# Patient Record
Sex: Female | Born: 1970 | Race: White | Hispanic: No | Marital: Married | Smoking: Former smoker
Health system: Southern US, Community
[De-identification: ages and names within clinical notes are randomized; demographics above are authoritative.]

## PROBLEM LIST (undated history)

## (undated) DIAGNOSIS — E282 Polycystic ovarian syndrome: Secondary | ICD-10-CM

## (undated) DIAGNOSIS — Z780 Asymptomatic menopausal state: Secondary | ICD-10-CM

## (undated) DIAGNOSIS — M2550 Pain in unspecified joint: Secondary | ICD-10-CM

## (undated) DIAGNOSIS — E538 Deficiency of other specified B group vitamins: Secondary | ICD-10-CM

## (undated) DIAGNOSIS — R7303 Prediabetes: Secondary | ICD-10-CM

## (undated) DIAGNOSIS — R0602 Shortness of breath: Secondary | ICD-10-CM

## (undated) DIAGNOSIS — F329 Major depressive disorder, single episode, unspecified: Secondary | ICD-10-CM

## (undated) DIAGNOSIS — R5383 Other fatigue: Secondary | ICD-10-CM

## (undated) DIAGNOSIS — K760 Fatty (change of) liver, not elsewhere classified: Secondary | ICD-10-CM

## (undated) DIAGNOSIS — E559 Vitamin D deficiency, unspecified: Secondary | ICD-10-CM

## (undated) DIAGNOSIS — M549 Dorsalgia, unspecified: Secondary | ICD-10-CM

## (undated) DIAGNOSIS — R600 Localized edema: Secondary | ICD-10-CM

## (undated) DIAGNOSIS — F419 Anxiety disorder, unspecified: Secondary | ICD-10-CM

## (undated) DIAGNOSIS — F32A Depression, unspecified: Secondary | ICD-10-CM

## (undated) DIAGNOSIS — N920 Excessive and frequent menstruation with regular cycle: Secondary | ICD-10-CM

## (undated) DIAGNOSIS — M25519 Pain in unspecified shoulder: Secondary | ICD-10-CM

## (undated) HISTORY — DX: Fatty (change of) liver, not elsewhere classified: K76.0

## (undated) HISTORY — DX: Localized edema: R60.0

## (undated) HISTORY — DX: Anxiety disorder, unspecified: F41.9

## (undated) HISTORY — DX: Prediabetes: R73.03

## (undated) HISTORY — DX: Vitamin D deficiency, unspecified: E55.9

## (undated) HISTORY — DX: Asymptomatic menopausal state: Z78.0

## (undated) HISTORY — DX: Pain in unspecified joint: M25.50

## (undated) HISTORY — DX: Dorsalgia, unspecified: M54.9

## (undated) HISTORY — DX: Pain in unspecified shoulder: M25.519

## (undated) HISTORY — DX: Polycystic ovarian syndrome: E28.2

## (undated) HISTORY — DX: Depression, unspecified: F32.A

## (undated) HISTORY — DX: Deficiency of other specified B group vitamins: E53.8

## (undated) HISTORY — DX: Shortness of breath: R06.02

## (undated) HISTORY — DX: Excessive and frequent menstruation with regular cycle: N92.0

## (undated) HISTORY — DX: Other fatigue: R53.83

---
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
## 1998-01-07 ENCOUNTER — Other Ambulatory Visit: Admission: RE | Admit: 1998-01-07 | Discharge: 1998-01-07 | Payer: Self-pay | Admitting: *Deleted

## 1998-07-29 ENCOUNTER — Other Ambulatory Visit: Admission: RE | Admit: 1998-07-29 | Discharge: 1998-07-29 | Payer: Self-pay | Admitting: *Deleted

## 2001-07-17 ENCOUNTER — Other Ambulatory Visit: Admission: RE | Admit: 2001-07-17 | Discharge: 2001-07-17 | Payer: Self-pay | Admitting: Gynecology

## 2002-06-25 ENCOUNTER — Ambulatory Visit (HOSPITAL_COMMUNITY): Admission: AD | Admit: 2002-06-25 | Discharge: 2002-06-25 | Payer: Self-pay | Admitting: Gynecology

## 2002-06-26 ENCOUNTER — Encounter (INDEPENDENT_AMBULATORY_CARE_PROVIDER_SITE_OTHER): Payer: Self-pay

## 2004-02-11 ENCOUNTER — Encounter: Admission: RE | Admit: 2004-02-11 | Discharge: 2004-05-11 | Payer: Self-pay | Admitting: Family Medicine

## 2005-12-19 ENCOUNTER — Other Ambulatory Visit: Admission: RE | Admit: 2005-12-19 | Discharge: 2005-12-19 | Payer: Self-pay | Admitting: Family Medicine

## 2009-08-12 ENCOUNTER — Encounter: Admission: RE | Admit: 2009-08-12 | Discharge: 2009-08-12 | Payer: Self-pay | Admitting: Family Medicine

## 2009-09-14 ENCOUNTER — Ambulatory Visit: Payer: Self-pay | Admitting: Surgery

## 2009-11-10 ENCOUNTER — Ambulatory Visit (HOSPITAL_COMMUNITY): Admission: RE | Admit: 2009-11-10 | Discharge: 2009-11-10 | Payer: Self-pay | Admitting: Obstetrics and Gynecology

## 2009-12-08 ENCOUNTER — Ambulatory Visit (HOSPITAL_COMMUNITY): Admission: RE | Admit: 2009-12-08 | Discharge: 2009-12-08 | Payer: Self-pay | Admitting: Obstetrics and Gynecology

## 2010-06-01 ENCOUNTER — Inpatient Hospital Stay (HOSPITAL_COMMUNITY)
Admission: AD | Admit: 2010-06-01 | Discharge: 2010-06-04 | Disposition: A | Discharge status: Still In Hospital | Payer: Self-pay | Source: Home / Self Care | Attending: Obstetrics and Gynecology | Admitting: Obstetrics and Gynecology

## 2010-06-02 LAB — CBC
HCT: 39.3 % (ref 36.0–46.0)
Hemoglobin: 13.6 g/dL (ref 12.0–15.0)
MCH: 28.3 pg (ref 26.0–34.0)
MCHC: 34.6 g/dL (ref 30.0–36.0)
MCV: 81.7 fL (ref 78.0–100.0)
Platelets: 293 10*3/uL (ref 150–400)
RBC: 4.81 MIL/uL (ref 3.87–5.11)
RDW: 15.5 % (ref 11.5–15.5)
WBC: 12.2 10*3/uL — ABNORMAL HIGH (ref 4.0–10.5)

## 2010-06-02 LAB — RPR: RPR Ser Ql: NONREACTIVE

## 2010-06-05 NOTE — Discharge Summary (Signed)
  NAMELANNA, LABELLA              ACCOUNT NO.:  [id number]  MEDICAL RECORD NO.:  [id number]          PATIENT TYPE:  INP  LOCATION:  9133                          FACILITY:  [facility]  PHYSICIAN:  Ilda Mori, M.D.   DATE OF BIRTH:  09-Feb-1971  DATE OF ADMISSION:  06/01/2010 DATE OF DISCHARGE:  06/04/2010                              DISCHARGE SUMMARY   FINAL DIAGNOSES: 1. Uterine pregnancy, delivered. 2. Living, well-female child. 3. Failure to progress. 4. Failed induction.  SECONDARY DIAGNOSES:  None.  PROCEDURE:  Primary low transverse cesarean section.  COMPLICATIONS:  None.  CONDITION ON DISCHARGE:  Improved.  This is a 40 year old gravida 3, para 0-0-2-0 female at 40 weeks and 3 days who was admitted for elective induction.  The patient progressed to 7 cm and arrested for 4 hours.  In addition she developed a category 2 fetal heartrate tracing.  The  decision was made to proceed with cesarean section.  She was brought to the operating room by Dr. Carrington Clamp where a primary low transverse cesarean section was performed without complication with delivery of a 8-pound 6-ounce female infant.  Apgar scores 9 and 9.  The patient's postoperative course was benign without significant fever or anemia and on the third postoperative day, she was felt to be ready for discharge.  She was discharged on a regular diet, told to limit her activity.  She was given Percocet five #25 tablets to take 1-2 every 4 hours for severe pain, instructed to take over-the- counter ibuprofen 600 mg for mild-to-moderate pain and asked to continue her prenatal vitamins.  She was also asked to return to the office in 4 weeks for followup evaluation.  Laboratory data revealed an admission hemoglobin of 13.9, postop hemoglobin 11.1, white count post labor was 18,700.     Ilda Mori, M.D.     RK/MEDQ  D:  06/04/2010  T:  06/05/2010  Job:  [id number]  Electronically Signed by Ilda Mori  M.D. on 06/05/2010 04:38:07 PM

## 2010-06-06 ENCOUNTER — Encounter: Payer: Self-pay | Admitting: Obstetrics and Gynecology

## 2010-06-07 LAB — CBC
HCT: 33.9 % — ABNORMAL LOW (ref 36.0–46.0)
Hemoglobin: 11.1 g/dL — ABNORMAL LOW (ref 12.0–15.0)
MCH: 27.1 pg (ref 26.0–34.0)
MCHC: 32.7 g/dL (ref 30.0–36.0)
MCV: 82.7 fL (ref 78.0–100.0)
Platelets: 236 10*3/uL (ref 150–400)
RBC: 4.1 MIL/uL (ref 3.87–5.11)
RDW: 15.5 % (ref 11.5–15.5)
WBC: 18.7 10*3/uL — ABNORMAL HIGH (ref 4.0–10.5)

## 2010-06-10 NOTE — Op Note (Addendum)
NAMECAMDYNN, MARANTO              ACCOUNT NO.:  [id number]  MEDICAL RECORD NO.:  [id number]          PATIENT TYPE:  INP  LOCATION:  9133                          FACILITY:  [facility]  PHYSICIAN:  Carrington Clamp, M.D. DATE OF BIRTH:  10/12/1970  DATE OF PROCEDURE:  06/01/2010 DATE OF DISCHARGE:                              OPERATIVE REPORT   PREOPERATIVE DIAGNOSIS:  Arrest of active phase and nonreassuring fetal heart tones, remote from vaginal delivery.  POSTOPERATIVE DIAGNOSIS:  Arrest of active phase and nonreassuring fetal heart tones, remote from vaginal delivery.  PROCEDURE:  Primary low-transverse cesarean section.  SURGEON:  Carrington Clamp, M.D.  ASSISTANT:  None.  ANESTHESIA:  Epidural.  FINDINGS:  A girl infant vertex but direct OP presentation, Apgars 9 and 9, weight was 8 pounds 6 ounces.  Normal tubes, ovaries, and uterus were seen.  SPECIMENS:  None.  ESTIMATED BLOOD LOSS:  800 mL.  IV FLUIDS:  1500 mL.  URINE OUTPUT:  200 mL.  COMPLICATIONS:  None.  MEDICATIONS:  Clinda, Cipro, and Pitocin.  Counts were correct x3.  TECHNIQUE:  After adequate epidural anesthesia was achieved, the patient was prepped and draped in usual sterile fashion in dorsal supine position with leftward tilt.  A Pfannenstiel skin incision was made with the scalpel and carried down to the fascia with the Bovie cautery.  The fascia was incised in the midline and carried in transverse curvilinear manner with the Mayo scissors.  The fascia was reflected superiorly inferiorly from the rectus muscles, and the rectus muscle was split in the midline.  The bowel free portion of the anterior peritoneum was entered into bluntly.  The peritoneum was extended opened in a superior and inferior manner with good visualization of the bowel and the bladder.  The Alexis instrument was then placed and the vesicouterine fascia tented up and incised in a transverse curvilinear manner.  The  bladder flap was retracted inferiorly and a 2-cm incision was made in the upper portion of the lower uterine segment.  This was done until a clear fluid was noted on entry into amnion, and the incision was then extended manually in a transverse curvilinear manner.  Baby was identified.  The vertex OP presentation delivered without complication.  Baby was bulb suctioned.  The cord was clamped and cut and baby was handed to awaiting pediatrics.  The cord bloods were then obtained and placenta delivered manually.  The uterus then closed with a running lock stitch of 0 Monocryl.  An extra couple of stitches on the left hand corner were performed in order to ensure that the angle of the incision had been closed, and there was a pumping vessel at that point as well.  An imbricating layer was then performed to ensure hemostasis and once hemostasis was achieved, the abdomen was irrigated.  The uterine incision was rendered hemostatic, and the ovaries and tubes were inspected and found to be normal.  The peritoneum was then closed in a running stitch of 2-0 Vicryl in a running fashion.  This incorporated the rectus muscles as a separate layer.  The fascia was then closed with running stitch  of 0 Vicryl.  The subcutaneous tissues was rendered hemostatic with the Bovie cautery and irrigation, and the bowel, and the subcutaneous tissue was then closed with interrupted stitches of 2-0 plain gut.  The skin was closed with staples.  The patient tolerated the procedure well, returned to recovery in stable condition.     Carrington Clamp, M.D.     MH/MEDQ  D:  06/01/2010  T:  06/02/2010  Job:  [id number]  Electronically Signed by Carrington Clamp MD on 06/10/2010 11:31:46 AM

## 2010-08-12 ENCOUNTER — Other Ambulatory Visit: Payer: Self-pay | Admitting: Dermatology

## 2010-09-28 NOTE — Procedures (Signed)
DUPLEX DEEP VENOUS EXAM - LOWER EXTREMITY   INDICATION:  Bilateral lower extremity edema of ankles/feet.   HISTORY:  Edema:  Intermittent ankle/feet.  Trauma/Surgery:  No.  Pain:  No.  PE:  No.  Previous DVT:  No.  Anticoagulants:  No.  Other:   DUPLEX EXAM:                CFV   SFV   PopV  PTV    GSV                R  L  R  L  R  L  R   L  R  L  Thrombosis    o  o  o  o  o  o  o   o  o  o  Spontaneous   +  +  +  +  +  +  +   +  +  +  Phasic        +  +  +  +  +  +  +   +  +  +  Augmentation  +  +  +  +  +  +  +   +  +  +  Compressible  +  +  +  +  +  +  +   +  +  +  Competent     +  +  +  +  +  +  +   +  +  +   Legend:  + - yes  o - no  p - partial  D - decreased   IMPRESSION:  1. No evidence of deep or superficial venous thrombosis bilaterally.  2. No evidence of significant venous insufficiency bilaterally.    _____________________________  V. Charlena Cross, MD   AS/MEDQ  D:  09/14/2009  T:  09/15/2009  Job:  [id number] [id number]

## 2010-09-28 NOTE — Assessment & Plan Note (Signed)
OFFICE VISIT   DARRIN, APODACA  DOB:  [birth year]                                       09/14/2009  ZOXWR#:[id number]   REASON FOR VISIT:  Swelling.   HISTORY:  This is a 40 year old female seen at the request Dr. Yehuda Mao  for evaluation of leg swelling.  The patient states that she initially  noticed swelling in her right leg approximately 12-13 years ago after a  skiing trip.  She denies any actual trauma but did state she was  potentially troubled due to her ski boots.  Over the past 2 years,  however, her swelling has gotten worse and is now involving both legs  with the right leg being the more severe.  It is improved with  elevation.  She is not having shortness of breath.  She has no  ulceration.   REVIEW OF SYSTEMS:  GENERAL:  Positive for weight gain.  Otherwise negative except for what is documented in the HPI.  Review of  systems is documented in the encounter form.   PAST MEDICAL HISTORY:  Leg edema, acoustic neuroma surgery.   FAMILY HISTORY:  Negative for cardiovascular disease at an early age,  negative for PE, negative for DVT.   SOCIAL HISTORY:  She is married.  Does not smoke, quit 2008, does not  drink.   PHYSICAL EXAMINATION:  Heart rate is 74, blood pressure 116/74,  temperature is 97.8.  General:  She is well-appearing, in no distress.  HEENT:  Within normal limits.  Neck:  There is no JVD.  Respirations are  nonlabored.  Extremities:  She has edema limited to her foot.  She has  palpable pulses.  There is no ulceration.   DIAGNOSTIC STUDIES:  Ultrasound was performed today that shows no  evidence of acute or chronic deep vein thrombosis.  She does not have  any evidence of venous insufficiency.   ASSESSMENT:  Leg swelling, right greater than left.   PLAN:  After review of the patient's ultrasound, I did not feel like her  swelling is related to venous insufficiency.  This most likely  represents lymphedema secondary to a  minor trauma in the past.  I have  recommended that she be fitted for compression stockings, which will  need to be worn lifelong, and I have also recommend that she see a  lymphedema specialist to help to attempt massage therapy to see if she  can have some improvement in her symptoms.  I plan on seeing her back on  a p.r.n. basis.     Jorge Ny, MD  Electronically Signed   VWB/MEDQ  D:  09/14/2009  T:  09/15/2009  Job:  [id number]   cc:   Dr. Carilyn Goodpasture

## 2010-10-01 NOTE — H&P (Signed)
Deborah Underwood, Deborah Underwood                          ACCOUNT NO.:  [id number]   MEDICAL RECORD NO.:  [id number]                   PATIENT TYPE:  MAT   LOCATION:  [facility]                                 FACILITY:  [facility]   PHYSICIAN:  Juan H. Lily Peer, M.D.             DATE OF BIRTH:  1970/08/17   DATE OF ADMISSION:  06/25/2002  DATE OF DISCHARGE:                                HISTORY & PHYSICAL   CHIEF COMPLAINT:  First-trimester vaginal bleeding.   HISTORY:  The patient is a 40 year old, gravida 1, para 0, who was seen  earlier today in the office, not sure of her last menstrual period,  indicating she had been spotting for approximately eight days.  On  inspection in the office, there was blood in the vaginal vault.  Her cervix  was not dilated.  There was minimal oozing from the cervical os.  She was  taken to the ultrasound department in our office, and an ultrasound  demonstrated an intrauterine gestational sac with no fetal heart motion and  the crown rump length equal at 5.6 mm consistent with six weeks and two  days.  A normal yolk sac was seen, measuring 4.1 mm, right ovary corpus  lutein cyst.  Left ovary was normal.  The blood type is O positive.  She was  in no acute distress, and she was going to be followed with quantitative  beta-HCG.  She had one this afternoon and was to have a repeat in 48 hours  along with an ultrasound to see the trend and to see if fetal viability was  noted at that point.  She called me later this afternoon complaining of  increased cramping and passage of large blood clots and on examination in  the emergency room, there were blood clots in the vaginal vault which after  removing, her cervix was found to be approximately 1-2 cm dilated and oozing  of blood from cervical os.  Hemodynamically she was stable.  Her vital signs  were stable.  She was afebrile.   PAST MEDICAL HISTORY:  1. The patient is ALLERGIC to PENICILLIN, KEFLEX.  2. She has had a  wisdom tooth removed in the past.  3. She denies any history of any sexually transmitted diseases or any pelvic     surgery.   PHYSICAL EXAMINATION:  GENERAL:  Well-developed, well-nourished female with  the above-mentioned complaint.  HEENT:  Unremarkable.  NECK:  Supple.  Trachea midline.  No carotid bruits, no thyromegaly.  LUNGS:  Clear to auscultation.  No rhonchi or wheezes.  HEART:  Regular rate and rhythm.  No murmurs or gallops.  ABDOMEN:  Slight tenderness in lower abdomen.  No rebound or guarding.  PELVIC:  Described above.  EXTREMITIES:  No edema.  No clonus.   PRENATAL LABORATORY DATA:  Prenatal labs in the office that were drawn  June 07, 2002, demonstrated a hemoglobin of 13.5, hematocrit  39.5, with a  platelet count of 267,000.  Blood type was O positive, and she was antibody  screen negative.  Her VDRL and hepatitis B surface antigen and HIV were  negative.  Rubella with evidence of immunity.   </ASSESSMENT/PLAN>  A 40 year old, gravida 1, para 0, with uncertain last menstrual period with  vaginal bleeding for the past eight days in increased amount this evening.  Seen earlier in the office today with a gestational sac consistent with six  weeks and two days, but no fetal heart motion was noted.  No past history of  any sexually transmitted diseases, pelvic surgery.  On evaluation this  evening, it appears that she has an incomplete abortion with the amount of  bleeding and cervical dilatation, so she will be taken to our operating  where she will undergo a dilatation and curettage under MAC anesthesia and  paracervical block.  The patient is ALLERGIC to PENICILLIN, KEFLEX.  She had  taken Minocin during the month of December for acne.  The risks, benefits,  pros, and cons of dilatation and curettage were discussed, including  infection, bleeding, trauma to internal organs, and perforation of the  uterus.  In the event of blood transfusion, there is potential risk  for  anaphylactic reaction, hepatitis, and acquired immune deficiency syndrome  from blood and blood products.  She does have a potential cross-reactivity  with cephalosporins, we may hold off and give her an oral antibiotic to take  starting this evening and tomorrow at home such as erythromycin.  The  patient will be followed in the office in three weeks for a postop visit.  Planned emergency dilatation and evacuation this evening.                                               Juan H. Lily Peer, M.D.    JHF/MEDQ  D:  06/25/2002  T:  06/25/2002  Job:  [id number]

## 2010-10-01 NOTE — Op Note (Signed)
   NAMELUJUANA, Deborah Underwood                          ACCOUNT NO.:  [id number]   MEDICAL RECORD NO.:  [id number]                   PATIENT TYPE:  MAT   LOCATION:  [facility]                                 [facility]ACILITY:  [facility]   PHYSICIAN:  Juan H. Lily Peer, M.D.             DATE OF BIRTH:  12/30/1970   DATE OF PROCEDURE:  06/25/2002  DATE OF DISCHARGE:                                 OPERATIVE REPORT   INDICATIONS FOR PROCEDURE:  This 40 year old gravida 1, para 0, with first  trimester incomplete abortion.   POSTOPERATIVE DIAGNOSIS:  First trimester incomplete abortion.   ANESTHESIA:  MAC and a paracervical block  consisting of 2% Xylocaine  1:100,000 epinephrine.   PROCEDURE:  Dilatation and evacuation.   FINDINGS:  Six weeks' size uterus, slightly anteverted.  No palpable adnexal  masses.  With blood coming outside the cervical os, with blood in the  vaginal vault.   DESCRIPTION OF PROCEDURE:  After the patient was adequately counseled, she  was taken to the operating room where she was placed in the low lithotomy  position.  Due to the fact that the patient had allergies to penicillin with  Keflex, it was decided to administer 300 mg of clindamycin immediately upon  completion of the procedure.  After she was placed in the low lithotomy  position, the vagina and perineum were prepped and draped in the usual  sterile fashion and a red rubber Roxan Hockey was inserted to empty the bladder  contents of approximately 50 mL.  After this, a Graves speculum was inserted  into the vaginal vault and 2% Xylocaine with 1:100,000 epinephrine was  infiltrated into the cervical stroma at the 2, 4, 8, and 10 o'clock  positions for a total of 10 mL.  Once this was completed, the cervix was  grasped with a single-tooth tenaculum on the anterior cervical lip and the  cervical canal was dilated to allow an 8-mm suction curette to be introduced  into the intrauterine cavity to remove products of conception.  This  was  interchanged with a small serrated curettage and after removal of all  products of conception, the patient received 30 mg of Toradol in the  operating room.  The single-tooth tenaculum was removed.  The patient was  transferred to the recovery room with stable vital signs.  Blood loss was  minimal.  Fluid resuscitation consisted of 500 mL of lactated Ringers' and  her blood type was O-positive.                                               Juan H. Lily Peer, M.D.    JHF/MEDQ  D:  06/25/2002  T:  06/25/2002  Job:  [id number]

## 2011-11-30 ENCOUNTER — Other Ambulatory Visit: Payer: Self-pay | Admitting: Obstetrics and Gynecology

## 2015-07-02 ENCOUNTER — Other Ambulatory Visit: Payer: Self-pay | Admitting: Obstetrics and Gynecology

## 2015-07-03 LAB — CYTOLOGY - PAP

## 2015-08-26 DIAGNOSIS — F411 Generalized anxiety disorder: Secondary | ICD-10-CM | POA: Diagnosis not present

## 2015-12-22 DIAGNOSIS — J069 Acute upper respiratory infection, unspecified: Secondary | ICD-10-CM | POA: Diagnosis not present

## 2016-02-12 DIAGNOSIS — Z131 Encounter for screening for diabetes mellitus: Secondary | ICD-10-CM | POA: Diagnosis not present

## 2016-02-12 DIAGNOSIS — R350 Frequency of micturition: Secondary | ICD-10-CM | POA: Diagnosis not present

## 2016-02-18 DIAGNOSIS — F411 Generalized anxiety disorder: Secondary | ICD-10-CM | POA: Diagnosis not present

## 2016-02-29 DIAGNOSIS — B078 Other viral warts: Secondary | ICD-10-CM | POA: Diagnosis not present

## 2016-03-02 DIAGNOSIS — M530 Cervicocranial syndrome: Secondary | ICD-10-CM | POA: Diagnosis not present

## 2016-03-02 DIAGNOSIS — M9901 Segmental and somatic dysfunction of cervical region: Secondary | ICD-10-CM | POA: Diagnosis not present

## 2016-03-03 DIAGNOSIS — F411 Generalized anxiety disorder: Secondary | ICD-10-CM | POA: Diagnosis not present

## 2016-03-08 DIAGNOSIS — M530 Cervicocranial syndrome: Secondary | ICD-10-CM | POA: Diagnosis not present

## 2016-03-08 DIAGNOSIS — M9901 Segmental and somatic dysfunction of cervical region: Secondary | ICD-10-CM | POA: Diagnosis not present

## 2016-03-09 DIAGNOSIS — F411 Generalized anxiety disorder: Secondary | ICD-10-CM | POA: Diagnosis not present

## 2016-03-10 DIAGNOSIS — M9901 Segmental and somatic dysfunction of cervical region: Secondary | ICD-10-CM | POA: Diagnosis not present

## 2016-03-10 DIAGNOSIS — M530 Cervicocranial syndrome: Secondary | ICD-10-CM | POA: Diagnosis not present

## 2016-03-22 DIAGNOSIS — M530 Cervicocranial syndrome: Secondary | ICD-10-CM | POA: Diagnosis not present

## 2016-03-22 DIAGNOSIS — M9901 Segmental and somatic dysfunction of cervical region: Secondary | ICD-10-CM | POA: Diagnosis not present

## 2016-03-23 DIAGNOSIS — M530 Cervicocranial syndrome: Secondary | ICD-10-CM | POA: Diagnosis not present

## 2016-03-23 DIAGNOSIS — M9901 Segmental and somatic dysfunction of cervical region: Secondary | ICD-10-CM | POA: Diagnosis not present

## 2016-04-01 DIAGNOSIS — F411 Generalized anxiety disorder: Secondary | ICD-10-CM | POA: Diagnosis not present

## 2016-04-04 DIAGNOSIS — M530 Cervicocranial syndrome: Secondary | ICD-10-CM | POA: Diagnosis not present

## 2016-04-04 DIAGNOSIS — M9901 Segmental and somatic dysfunction of cervical region: Secondary | ICD-10-CM | POA: Diagnosis not present

## 2016-04-06 DIAGNOSIS — M9901 Segmental and somatic dysfunction of cervical region: Secondary | ICD-10-CM | POA: Diagnosis not present

## 2016-04-06 DIAGNOSIS — M530 Cervicocranial syndrome: Secondary | ICD-10-CM | POA: Diagnosis not present

## 2016-04-12 DIAGNOSIS — M9901 Segmental and somatic dysfunction of cervical region: Secondary | ICD-10-CM | POA: Diagnosis not present

## 2016-04-12 DIAGNOSIS — M530 Cervicocranial syndrome: Secondary | ICD-10-CM | POA: Diagnosis not present

## 2016-04-13 DIAGNOSIS — R635 Abnormal weight gain: Secondary | ICD-10-CM | POA: Diagnosis not present

## 2016-04-13 DIAGNOSIS — E559 Vitamin D deficiency, unspecified: Secondary | ICD-10-CM | POA: Diagnosis not present

## 2016-04-13 DIAGNOSIS — R05 Cough: Secondary | ICD-10-CM | POA: Diagnosis not present

## 2016-04-13 DIAGNOSIS — R5383 Other fatigue: Secondary | ICD-10-CM | POA: Diagnosis not present

## 2016-04-18 DIAGNOSIS — M530 Cervicocranial syndrome: Secondary | ICD-10-CM | POA: Diagnosis not present

## 2016-04-18 DIAGNOSIS — M9901 Segmental and somatic dysfunction of cervical region: Secondary | ICD-10-CM | POA: Diagnosis not present

## 2016-04-20 DIAGNOSIS — M530 Cervicocranial syndrome: Secondary | ICD-10-CM | POA: Diagnosis not present

## 2016-04-20 DIAGNOSIS — M9901 Segmental and somatic dysfunction of cervical region: Secondary | ICD-10-CM | POA: Diagnosis not present

## 2016-04-22 DIAGNOSIS — F411 Generalized anxiety disorder: Secondary | ICD-10-CM | POA: Diagnosis not present

## 2016-04-25 DIAGNOSIS — M9901 Segmental and somatic dysfunction of cervical region: Secondary | ICD-10-CM | POA: Diagnosis not present

## 2016-04-25 DIAGNOSIS — M530 Cervicocranial syndrome: Secondary | ICD-10-CM | POA: Diagnosis not present

## 2016-04-27 DIAGNOSIS — M530 Cervicocranial syndrome: Secondary | ICD-10-CM | POA: Diagnosis not present

## 2016-04-27 DIAGNOSIS — M9901 Segmental and somatic dysfunction of cervical region: Secondary | ICD-10-CM | POA: Diagnosis not present

## 2016-05-03 DIAGNOSIS — M9901 Segmental and somatic dysfunction of cervical region: Secondary | ICD-10-CM | POA: Diagnosis not present

## 2016-05-03 DIAGNOSIS — F411 Generalized anxiety disorder: Secondary | ICD-10-CM | POA: Diagnosis not present

## 2016-05-03 DIAGNOSIS — M530 Cervicocranial syndrome: Secondary | ICD-10-CM | POA: Diagnosis not present

## 2016-05-27 DIAGNOSIS — F411 Generalized anxiety disorder: Secondary | ICD-10-CM | POA: Diagnosis not present

## 2016-06-07 DIAGNOSIS — F411 Generalized anxiety disorder: Secondary | ICD-10-CM | POA: Diagnosis not present

## 2016-07-04 DIAGNOSIS — F411 Generalized anxiety disorder: Secondary | ICD-10-CM | POA: Diagnosis not present

## 2016-07-13 DIAGNOSIS — M9901 Segmental and somatic dysfunction of cervical region: Secondary | ICD-10-CM | POA: Diagnosis not present

## 2016-07-13 DIAGNOSIS — M9902 Segmental and somatic dysfunction of thoracic region: Secondary | ICD-10-CM | POA: Diagnosis not present

## 2016-07-13 DIAGNOSIS — M530 Cervicocranial syndrome: Secondary | ICD-10-CM | POA: Diagnosis not present

## 2016-07-13 DIAGNOSIS — M9903 Segmental and somatic dysfunction of lumbar region: Secondary | ICD-10-CM | POA: Diagnosis not present

## 2016-07-18 DIAGNOSIS — M9902 Segmental and somatic dysfunction of thoracic region: Secondary | ICD-10-CM | POA: Diagnosis not present

## 2016-07-18 DIAGNOSIS — M530 Cervicocranial syndrome: Secondary | ICD-10-CM | POA: Diagnosis not present

## 2016-07-18 DIAGNOSIS — M9901 Segmental and somatic dysfunction of cervical region: Secondary | ICD-10-CM | POA: Diagnosis not present

## 2016-07-18 DIAGNOSIS — M9903 Segmental and somatic dysfunction of lumbar region: Secondary | ICD-10-CM | POA: Diagnosis not present

## 2016-07-19 DIAGNOSIS — D2262 Melanocytic nevi of left upper limb, including shoulder: Secondary | ICD-10-CM | POA: Diagnosis not present

## 2016-07-19 DIAGNOSIS — D485 Neoplasm of uncertain behavior of skin: Secondary | ICD-10-CM | POA: Diagnosis not present

## 2016-07-19 DIAGNOSIS — F411 Generalized anxiety disorder: Secondary | ICD-10-CM | POA: Diagnosis not present

## 2016-08-02 DIAGNOSIS — F411 Generalized anxiety disorder: Secondary | ICD-10-CM | POA: Diagnosis not present

## 2016-08-08 DIAGNOSIS — M9903 Segmental and somatic dysfunction of lumbar region: Secondary | ICD-10-CM | POA: Diagnosis not present

## 2016-08-08 DIAGNOSIS — M9902 Segmental and somatic dysfunction of thoracic region: Secondary | ICD-10-CM | POA: Diagnosis not present

## 2016-08-08 DIAGNOSIS — M530 Cervicocranial syndrome: Secondary | ICD-10-CM | POA: Diagnosis not present

## 2016-08-08 DIAGNOSIS — M9901 Segmental and somatic dysfunction of cervical region: Secondary | ICD-10-CM | POA: Diagnosis not present

## 2016-08-15 DIAGNOSIS — M79671 Pain in right foot: Secondary | ICD-10-CM | POA: Diagnosis not present

## 2016-08-15 DIAGNOSIS — M79672 Pain in left foot: Secondary | ICD-10-CM | POA: Diagnosis not present

## 2016-08-15 DIAGNOSIS — M2550 Pain in unspecified joint: Secondary | ICD-10-CM | POA: Diagnosis not present

## 2016-08-17 DIAGNOSIS — Z6839 Body mass index (BMI) 39.0-39.9, adult: Secondary | ICD-10-CM | POA: Diagnosis not present

## 2016-08-17 DIAGNOSIS — Z01419 Encounter for gynecological examination (general) (routine) without abnormal findings: Secondary | ICD-10-CM | POA: Diagnosis not present

## 2016-08-17 DIAGNOSIS — Z1231 Encounter for screening mammogram for malignant neoplasm of breast: Secondary | ICD-10-CM | POA: Diagnosis not present

## 2016-08-22 DIAGNOSIS — F411 Generalized anxiety disorder: Secondary | ICD-10-CM | POA: Diagnosis not present

## 2016-09-13 DIAGNOSIS — F411 Generalized anxiety disorder: Secondary | ICD-10-CM | POA: Diagnosis not present

## 2016-09-27 DIAGNOSIS — F411 Generalized anxiety disorder: Secondary | ICD-10-CM | POA: Diagnosis not present

## 2016-10-11 DIAGNOSIS — F411 Generalized anxiety disorder: Secondary | ICD-10-CM | POA: Diagnosis not present

## 2016-10-25 DIAGNOSIS — F411 Generalized anxiety disorder: Secondary | ICD-10-CM | POA: Diagnosis not present

## 2016-11-02 DIAGNOSIS — M9903 Segmental and somatic dysfunction of lumbar region: Secondary | ICD-10-CM | POA: Diagnosis not present

## 2016-11-02 DIAGNOSIS — M5441 Lumbago with sciatica, right side: Secondary | ICD-10-CM | POA: Diagnosis not present

## 2016-11-07 DIAGNOSIS — M5441 Lumbago with sciatica, right side: Secondary | ICD-10-CM | POA: Diagnosis not present

## 2016-11-07 DIAGNOSIS — M9903 Segmental and somatic dysfunction of lumbar region: Secondary | ICD-10-CM | POA: Diagnosis not present

## 2016-11-22 DIAGNOSIS — F411 Generalized anxiety disorder: Secondary | ICD-10-CM | POA: Diagnosis not present

## 2016-12-12 DIAGNOSIS — F411 Generalized anxiety disorder: Secondary | ICD-10-CM | POA: Diagnosis not present

## 2017-01-04 DIAGNOSIS — F411 Generalized anxiety disorder: Secondary | ICD-10-CM | POA: Diagnosis not present

## 2017-01-31 DIAGNOSIS — F411 Generalized anxiety disorder: Secondary | ICD-10-CM | POA: Diagnosis not present

## 2017-02-14 DIAGNOSIS — F411 Generalized anxiety disorder: Secondary | ICD-10-CM | POA: Diagnosis not present

## 2017-02-28 DIAGNOSIS — F411 Generalized anxiety disorder: Secondary | ICD-10-CM | POA: Diagnosis not present

## 2017-03-03 DIAGNOSIS — J029 Acute pharyngitis, unspecified: Secondary | ICD-10-CM | POA: Diagnosis not present

## 2017-03-14 DIAGNOSIS — F411 Generalized anxiety disorder: Secondary | ICD-10-CM | POA: Diagnosis not present

## 2017-04-04 DIAGNOSIS — F411 Generalized anxiety disorder: Secondary | ICD-10-CM | POA: Diagnosis not present

## 2017-04-18 DIAGNOSIS — F411 Generalized anxiety disorder: Secondary | ICD-10-CM | POA: Diagnosis not present

## 2017-05-02 DIAGNOSIS — F411 Generalized anxiety disorder: Secondary | ICD-10-CM | POA: Diagnosis not present

## 2017-05-22 DIAGNOSIS — F411 Generalized anxiety disorder: Secondary | ICD-10-CM | POA: Diagnosis not present

## 2017-06-06 DIAGNOSIS — F411 Generalized anxiety disorder: Secondary | ICD-10-CM | POA: Diagnosis not present

## 2017-07-23 DIAGNOSIS — J039 Acute tonsillitis, unspecified: Secondary | ICD-10-CM | POA: Diagnosis not present

## 2017-09-25 DIAGNOSIS — F4320 Adjustment disorder, unspecified: Secondary | ICD-10-CM | POA: Diagnosis not present

## 2018-01-09 DIAGNOSIS — L821 Other seborrheic keratosis: Secondary | ICD-10-CM | POA: Diagnosis not present

## 2018-01-09 DIAGNOSIS — L408 Other psoriasis: Secondary | ICD-10-CM | POA: Diagnosis not present

## 2018-01-24 DIAGNOSIS — F4323 Adjustment disorder with mixed anxiety and depressed mood: Secondary | ICD-10-CM | POA: Diagnosis not present

## 2018-01-30 DIAGNOSIS — Z6839 Body mass index (BMI) 39.0-39.9, adult: Secondary | ICD-10-CM | POA: Diagnosis not present

## 2018-01-30 DIAGNOSIS — Z01419 Encounter for gynecological examination (general) (routine) without abnormal findings: Secondary | ICD-10-CM | POA: Diagnosis not present

## 2018-02-02 DIAGNOSIS — F4323 Adjustment disorder with mixed anxiety and depressed mood: Secondary | ICD-10-CM | POA: Diagnosis not present

## 2018-02-08 DIAGNOSIS — F4323 Adjustment disorder with mixed anxiety and depressed mood: Secondary | ICD-10-CM | POA: Diagnosis not present

## 2018-02-22 DIAGNOSIS — F4323 Adjustment disorder with mixed anxiety and depressed mood: Secondary | ICD-10-CM | POA: Diagnosis not present

## 2018-02-28 DIAGNOSIS — J208 Acute bronchitis due to other specified organisms: Secondary | ICD-10-CM | POA: Diagnosis not present

## 2018-02-28 DIAGNOSIS — F4323 Adjustment disorder with mixed anxiety and depressed mood: Secondary | ICD-10-CM | POA: Diagnosis not present

## 2018-02-28 DIAGNOSIS — B9689 Other specified bacterial agents as the cause of diseases classified elsewhere: Secondary | ICD-10-CM | POA: Diagnosis not present

## 2018-03-14 DIAGNOSIS — F4323 Adjustment disorder with mixed anxiety and depressed mood: Secondary | ICD-10-CM | POA: Diagnosis not present

## 2018-03-29 DIAGNOSIS — F4323 Adjustment disorder with mixed anxiety and depressed mood: Secondary | ICD-10-CM | POA: Diagnosis not present

## 2018-04-04 DIAGNOSIS — F4323 Adjustment disorder with mixed anxiety and depressed mood: Secondary | ICD-10-CM | POA: Diagnosis not present

## 2018-04-10 DIAGNOSIS — F4323 Adjustment disorder with mixed anxiety and depressed mood: Secondary | ICD-10-CM | POA: Diagnosis not present

## 2018-04-24 DIAGNOSIS — F4323 Adjustment disorder with mixed anxiety and depressed mood: Secondary | ICD-10-CM | POA: Diagnosis not present

## 2018-05-01 DIAGNOSIS — F4323 Adjustment disorder with mixed anxiety and depressed mood: Secondary | ICD-10-CM | POA: Diagnosis not present

## 2018-05-14 DIAGNOSIS — F4323 Adjustment disorder with mixed anxiety and depressed mood: Secondary | ICD-10-CM | POA: Diagnosis not present

## 2018-05-23 DIAGNOSIS — F4323 Adjustment disorder with mixed anxiety and depressed mood: Secondary | ICD-10-CM | POA: Diagnosis not present

## 2018-05-27 DIAGNOSIS — R05 Cough: Secondary | ICD-10-CM | POA: Diagnosis not present

## 2018-05-27 DIAGNOSIS — J309 Allergic rhinitis, unspecified: Secondary | ICD-10-CM | POA: Diagnosis not present

## 2018-05-31 DIAGNOSIS — F4323 Adjustment disorder with mixed anxiety and depressed mood: Secondary | ICD-10-CM | POA: Diagnosis not present

## 2018-06-06 DIAGNOSIS — F4323 Adjustment disorder with mixed anxiety and depressed mood: Secondary | ICD-10-CM | POA: Diagnosis not present

## 2018-06-12 DIAGNOSIS — Z1231 Encounter for screening mammogram for malignant neoplasm of breast: Secondary | ICD-10-CM | POA: Diagnosis not present

## 2018-06-12 DIAGNOSIS — Z6839 Body mass index (BMI) 39.0-39.9, adult: Secondary | ICD-10-CM | POA: Diagnosis not present

## 2018-06-13 DIAGNOSIS — F4323 Adjustment disorder with mixed anxiety and depressed mood: Secondary | ICD-10-CM | POA: Diagnosis not present

## 2018-06-20 DIAGNOSIS — F4323 Adjustment disorder with mixed anxiety and depressed mood: Secondary | ICD-10-CM | POA: Diagnosis not present

## 2018-06-27 DIAGNOSIS — F4323 Adjustment disorder with mixed anxiety and depressed mood: Secondary | ICD-10-CM | POA: Diagnosis not present

## 2018-07-12 DIAGNOSIS — F4323 Adjustment disorder with mixed anxiety and depressed mood: Secondary | ICD-10-CM | POA: Diagnosis not present

## 2018-07-18 DIAGNOSIS — F4323 Adjustment disorder with mixed anxiety and depressed mood: Secondary | ICD-10-CM | POA: Diagnosis not present

## 2018-07-25 DIAGNOSIS — F4323 Adjustment disorder with mixed anxiety and depressed mood: Secondary | ICD-10-CM | POA: Diagnosis not present

## 2018-08-02 DIAGNOSIS — F4323 Adjustment disorder with mixed anxiety and depressed mood: Secondary | ICD-10-CM | POA: Diagnosis not present

## 2018-08-08 DIAGNOSIS — F4323 Adjustment disorder with mixed anxiety and depressed mood: Secondary | ICD-10-CM | POA: Diagnosis not present

## 2018-08-15 DIAGNOSIS — F4323 Adjustment disorder with mixed anxiety and depressed mood: Secondary | ICD-10-CM | POA: Diagnosis not present

## 2018-08-23 DIAGNOSIS — F4323 Adjustment disorder with mixed anxiety and depressed mood: Secondary | ICD-10-CM | POA: Diagnosis not present

## 2018-08-30 DIAGNOSIS — F4323 Adjustment disorder with mixed anxiety and depressed mood: Secondary | ICD-10-CM | POA: Diagnosis not present

## 2018-09-06 DIAGNOSIS — F4323 Adjustment disorder with mixed anxiety and depressed mood: Secondary | ICD-10-CM | POA: Diagnosis not present

## 2018-09-13 DIAGNOSIS — F4323 Adjustment disorder with mixed anxiety and depressed mood: Secondary | ICD-10-CM | POA: Diagnosis not present

## 2018-09-17 DIAGNOSIS — F4323 Adjustment disorder with mixed anxiety and depressed mood: Secondary | ICD-10-CM | POA: Diagnosis not present

## 2018-09-26 DIAGNOSIS — F4323 Adjustment disorder with mixed anxiety and depressed mood: Secondary | ICD-10-CM | POA: Diagnosis not present

## 2018-09-29 DIAGNOSIS — H6692 Otitis media, unspecified, left ear: Secondary | ICD-10-CM | POA: Diagnosis not present

## 2018-10-03 DIAGNOSIS — F4323 Adjustment disorder with mixed anxiety and depressed mood: Secondary | ICD-10-CM | POA: Diagnosis not present

## 2018-10-24 DIAGNOSIS — F4323 Adjustment disorder with mixed anxiety and depressed mood: Secondary | ICD-10-CM | POA: Diagnosis not present

## 2018-11-07 DIAGNOSIS — F4323 Adjustment disorder with mixed anxiety and depressed mood: Secondary | ICD-10-CM | POA: Diagnosis not present

## 2018-11-21 DIAGNOSIS — F4323 Adjustment disorder with mixed anxiety and depressed mood: Secondary | ICD-10-CM | POA: Diagnosis not present

## 2018-11-26 DIAGNOSIS — F4323 Adjustment disorder with mixed anxiety and depressed mood: Secondary | ICD-10-CM | POA: Diagnosis not present

## 2018-12-05 DIAGNOSIS — F4323 Adjustment disorder with mixed anxiety and depressed mood: Secondary | ICD-10-CM | POA: Diagnosis not present

## 2018-12-06 ENCOUNTER — Other Ambulatory Visit: Payer: Self-pay

## 2018-12-06 DIAGNOSIS — Z20822 Contact with and (suspected) exposure to covid-19: Secondary | ICD-10-CM

## 2018-12-08 LAB — NOVEL CORONAVIRUS, NAA: SARS-CoV-2, NAA: NOT DETECTED

## 2018-12-09 ENCOUNTER — Telehealth: Payer: Self-pay

## 2018-12-09 NOTE — Telephone Encounter (Signed)

## 2018-12-14 DIAGNOSIS — F4323 Adjustment disorder with mixed anxiety and depressed mood: Secondary | ICD-10-CM | POA: Diagnosis not present

## 2019-06-14 DIAGNOSIS — Z124 Encounter for screening for malignant neoplasm of cervix: Secondary | ICD-10-CM | POA: Diagnosis not present

## 2019-06-14 DIAGNOSIS — Z01419 Encounter for gynecological examination (general) (routine) without abnormal findings: Secondary | ICD-10-CM | POA: Diagnosis not present

## 2019-06-14 DIAGNOSIS — Z6841 Body Mass Index (BMI) 40.0 and over, adult: Secondary | ICD-10-CM | POA: Diagnosis not present

## 2019-07-18 DIAGNOSIS — Z6841 Body Mass Index (BMI) 40.0 and over, adult: Secondary | ICD-10-CM | POA: Diagnosis not present

## 2019-07-18 DIAGNOSIS — Z0000 Encounter for general adult medical examination without abnormal findings: Secondary | ICD-10-CM | POA: Diagnosis not present

## 2019-07-18 DIAGNOSIS — N898 Other specified noninflammatory disorders of vagina: Secondary | ICD-10-CM | POA: Diagnosis not present

## 2019-07-18 DIAGNOSIS — Z1231 Encounter for screening mammogram for malignant neoplasm of breast: Secondary | ICD-10-CM | POA: Diagnosis not present

## 2019-07-19 LAB — COMPREHENSIVE METABOLIC PANEL WITH GFR
Albumin: 4 (ref 3.5–5.0)
Calcium: 9.2 (ref 8.7–10.7)
GFR calc Af Amer: 79
GFR calc non Af Amer: 68
Globulin: 3

## 2019-07-19 LAB — BASIC METABOLIC PANEL
BUN: 12 (ref 4–21)
CO2: 23 — AB (ref 13–22)
Chloride: 104 (ref 99–108)
Creatinine: 1 (ref 0.5–1.1)
Glucose: 96
Potassium: 4.8 (ref 3.4–5.3)
Sodium: 141 (ref 137–147)

## 2019-07-19 LAB — HEMOGLOBIN A1C: Hemoglobin A1C: 5.6

## 2019-07-19 LAB — LIPID PANEL
Cholesterol: 205 — AB (ref 0–200)
HDL: 56 (ref 35–70)
LDL Cholesterol: 129
LDl/HDL Ratio: 2.3
Triglycerides: 112 (ref 40–160)

## 2019-07-19 LAB — HEPATIC FUNCTION PANEL
ALT: 21 (ref 7–35)
AST: 17 (ref 13–35)
Alkaline Phosphatase: 87 (ref 25–125)
Bilirubin, Total: 0.2

## 2019-07-19 LAB — TSH: TSH: 3.9 (ref 0.41–5.90)

## 2019-07-19 LAB — CBC AND DIFFERENTIAL
HCT: 43 (ref 36–46)
Hemoglobin: 14.2 (ref 12.0–16.0)
Neutrophils Absolute: 5
Platelets: 329 (ref 150–399)
WBC: 8.1

## 2019-07-19 LAB — CBC: RBC: 5.1 (ref 3.87–5.11)

## 2019-07-30 ENCOUNTER — Telehealth: Payer: Self-pay | Admitting: Family Medicine

## 2019-07-30 NOTE — Telephone Encounter (Signed)
Can schedule for Friday afternoon in a virtual slot.

## 2019-07-30 NOTE — Telephone Encounter (Signed)
Pt stated she is expeirncing peri-menopause since mid-Feburary and she has been bleeding non-stop vaginally (normal to heavy but last two weeks has been heavy). She has been very fatigue for a long time-her OBGYN did a blood test and her TSH has been on the high side and recommened that she see a PCP.   Pt would like to be established with Dr. [place] and can not wait until her next availability in July due to her bleeding vaginally non-stop since February.   Pt can be reached at [phone]

## 2019-07-30 NOTE — Telephone Encounter (Signed)
Pt is scheduled for in Office this Friday 3/19 at 3:00pm. She will come early to do npp.

## 2019-08-02 ENCOUNTER — Ambulatory Visit: Payer: BC Managed Care – PPO | Admitting: Family Medicine

## 2019-08-02 ENCOUNTER — Encounter: Payer: Self-pay | Admitting: Family Medicine

## 2019-08-02 ENCOUNTER — Other Ambulatory Visit: Payer: Self-pay

## 2019-08-02 VITALS — BP 128/80 | HR 100 | Resp 12 | Ht 67.0 in | Wt 278.0 lb

## 2019-08-02 DIAGNOSIS — R519 Headache, unspecified: Secondary | ICD-10-CM

## 2019-08-02 DIAGNOSIS — E559 Vitamin D deficiency, unspecified: Secondary | ICD-10-CM | POA: Diagnosis not present

## 2019-08-02 DIAGNOSIS — R200 Anesthesia of skin: Secondary | ICD-10-CM

## 2019-08-02 DIAGNOSIS — R0683 Snoring: Secondary | ICD-10-CM | POA: Diagnosis not present

## 2019-08-02 DIAGNOSIS — N938 Other specified abnormal uterine and vaginal bleeding: Secondary | ICD-10-CM | POA: Diagnosis not present

## 2019-08-02 DIAGNOSIS — R5383 Other fatigue: Secondary | ICD-10-CM

## 2019-08-02 LAB — CBC
HCT: 39.8 % (ref 35.0–45.0)
Hemoglobin: 12.9 g/dL (ref 11.7–15.5)
MCH: 27.4 pg (ref 27.0–33.0)
MCHC: 32.4 g/dL (ref 32.0–36.0)
MCV: 84.5 fL (ref 80.0–100.0)
MPV: 9.7 fL (ref 7.5–12.5)
Platelets: 422 10*3/uL — ABNORMAL HIGH (ref 140–400)
RBC: 4.71 10*6/uL (ref 3.80–5.10)
RDW: 13.7 % (ref 11.0–15.0)
WBC: 8.4 10*3/uL (ref 3.8–10.8)

## 2019-08-02 LAB — VITAMIN B12: Vitamin B-12: 544 pg/mL (ref 200–1100)

## 2019-08-02 LAB — TSH: TSH: 1.93 mIU/L

## 2019-08-02 LAB — VITAMIN D 25 HYDROXY (VIT D DEFICIENCY, FRACTURES): Vit D, 25-Hydroxy: 22 ng/mL — ABNORMAL LOW (ref 30–100)

## 2019-08-02 NOTE — Patient Instructions (Signed)
Fatigue is a common symptom associated with multiple factors: psychologic,medications, systemic illness, sleep disorders,infections, and unknown causes. Some work-up can be done to evaluate for common causes as thyroid disease,anemia,diabetes, or abnormalities in calcium,potassium,or sodium. Regular physical activity as tolerated and a healthy diet is usually might help and usually recommended for chronic fatigue.  I am sending you to have a discussion about sleep study. I will send a message to your gyn. I do not think vaginal bleeding is related to thyroid disease.  Will check B12 ans vit D.

## 2019-08-02 NOTE — Progress Notes (Signed)
HPI:   Deborah Underwood is a 49 y.o. female, who is here today to establish care.  Former PCP: [facility]. Last preventive routine visit: Last gyn exam 06/14/19.  Chronic medical problems: Schwannoma tumor resection in 2012. She is not longer following with oncologist. Postpartum depression.  She would like to go through recent lab results, ordered by her gyn.  -Hyperlipidemia. Currently she is on no pharmacologic treatment  Lab Results  Component Value Date   CHOL 205 (A) 07/19/2019   HDL 56 07/19/2019   LDLCALC 129 07/19/2019   TRIG 112 07/19/2019   Vitamin D deficiency: Currently she is on vitamin D 2000 units 5 tablets daily. She does not remember when her last 15 [place] vitamin D was checked, a few years ago.  Concerns today: Fatigue,global headache, "shaky,and foggy brain" for about 3 months, Fatigue has been worse for the past month. She does not wake up with headache.It is not associated with photophobia,N/V.  She sleeps about 6 hours. Frequently she falls asleep while she is waiting sitting in her car to pick up her child from school. She has not fallen asleep while driving.  No known hx of OSA but her husband has complained of her loud snoring. Sleeps about 6 hours. She does not feel rested first thing in the morning. Takes naps x 1-2 during the day.  Having vaginal bleeding since 06/21/2019, most of the time like a normal menstrual period but sometimes heavy. She wonders if she is getting closer to menopause.  No prior hx of abnormal menses. No associated pelvic pain,vaginal discharge,or urinary symptoms. According to patient, she was told that it could be related to thyroid disease. + Irritable. States that her TSH was in high normal range and she has read that normal range in wide and needed to be lower. She wonders if there would be serious adverse effects if she start thyroid hormonal supplementation.  Lab Results  Component Value Date   TSH 3.90 07/19/2019   Upon further questioning she reports that for 2-3 months she has had intermittent episodes of upper extremities numbness and needle sensation, when she is in bed. It does not follow dermatoma.  No problems during the day. Occasional neck pain,no radiated. Problem has been stable. No associated weakness.  Lab Results  Component Value Date   WBC 8.1 07/19/2019   HGB 14.2 07/19/2019   HCT 43 07/19/2019   MCV 82.7 06/02/2010   PLT 329 07/19/2019   Denies current depression or anxiety symptom.  No associated fever,chills,night sweats,or swollen glands.  Review of Systems  Constitutional: Negative for activity change and appetite change.  HENT: Negative for mouth sores, nosebleeds and sore throat.   Eyes: Negative for redness and visual disturbance.  Respiratory: Negative for cough, shortness of breath and wheezing.   Cardiovascular: Negative for chest pain, palpitations and leg swelling.  Gastrointestinal: Negative for abdominal pain.       Negative for changes in bowel habits.  Endocrine: Negative for cold intolerance and heat intolerance.  Genitourinary: Negative for decreased urine volume, dysuria and hematuria.  Musculoskeletal: Negative for gait problem and myalgias.  Allergic/Immunologic: Positive for environmental allergies.  Neurological: Negative for tremors, syncope and facial asymmetry.  Hematological: Does not bruise/bleed easily.  Psychiatric/Behavioral: Positive for sleep disturbance. Negative for confusion.  Rest see pertinent positives and negatives per HPI.  No current outpatient medications on file prior to visit.   No current facility-administered medications on file prior to visit.  Past Medical History:  Diagnosis Date  . Depression    Allergies  Allergen Reactions  . Latex Itching  . Cephalexin Rash  . Penicillin G Rash  . Other     Family History  Problem Relation Age of Onset  . Arthritis Mother   . Hearing loss  Mother   . Hearing loss Father   . Heart disease Father   . Hypertension Father   . Alcohol abuse Paternal Grandfather     Social History   Socioeconomic History  . Marital status: Married    Spouse name: Not on file  . Number of children: Not on file  . Years of education: Not on file  . Highest education level: Not on file  Occupational History  . Not on file  Tobacco Use  . Smoking status: Former Games developer  . Smokeless tobacco: Never Used  Substance and Sexual Activity  . Alcohol use: Yes  . Drug use: Never  . Sexual activity: Not Currently    Partners: Male  Other Topics Concern  . Not on file  Social History Narrative  . Not on file   Social Determinants of Health   Financial Resource Strain:   . Difficulty of Paying Living Expenses:   Food Insecurity:   . Worried About Programme researcher, broadcasting/film/video in the Last Year:   . Barista in the Last Year:   Transportation Needs:   . Freight forwarder (Medical):   Marland Kitchen Lack of Transportation (Non-Medical):   Physical Activity:   . Days of Exercise per Week:   . Minutes of Exercise per Session:   Stress:   . Feeling of Stress :   Social Connections:   . Frequency of Communication with Friends and Family:   . Frequency of Social Gatherings with Friends and Family:   . Attends Religious Services:   . Active Member of Clubs or Organizations:   . Attends Banker Meetings:   Marland Kitchen Marital Status:     Vitals:   08/02/19 1454  BP: 128/80  Pulse: 100  Resp: 12  SpO2: 97%   Body mass index is 43.54 kg/m.  Physical Exam  Nursing note and vitals reviewed. Constitutional: She is oriented to person, place, and time. She appears well-developed. No distress.  HENT:  Head: Normocephalic and atraumatic.  Mouth/Throat: Oropharynx is clear and moist and mucous membranes are normal.  Eyes: Pupils are equal, round, and reactive to light. Conjunctivae are normal.  Neck: No tracheal deviation present. No thyromegaly  present.  Cardiovascular: Normal rate and regular rhythm.  No murmur heard. Pulses:      Dorsalis pedis pulses are 2+ on the right side and 2+ on the left side.  Respiratory: Effort normal and breath sounds normal. No respiratory distress.  GI: Soft. She exhibits no mass. There is no hepatomegaly. There is no abdominal tenderness.  Musculoskeletal:        General: No edema.  Lymphadenopathy:    She has no cervical adenopathy.  Neurological: She is alert and oriented to person, place, and time. She has normal strength. No cranial nerve deficit. Gait normal.  Skin: Skin is warm. No rash noted. No erythema.  Psychiatric: Her mood appears anxious.  Well groomed, good eye contact.    ASSESSMENT AND PLAN:  Ms. Dacie was seen today for establish care and peri-menopause.  Diagnoses and all orders for this visit:  Orders Placed This Encounter  Procedures  . CBC  . TSH  .  Vitamin B12  . VITAMIN D 25 Hydroxy (Vit-D Deficiency, Fractures)  . Ambulatory referral to Pulmonology   Lab Results  Component Value Date   WBC 8.4 08/02/2019   HGB 12.9 08/02/2019   HCT 39.8 08/02/2019   MCV 84.5 08/02/2019   PLT 422 (H) 08/02/2019   Lab Results  Component Value Date   TSH 1.93 08/02/2019   Lab Results  Component Value Date   [id number] 017 08/02/2019    Fatigue, unspecified type We discussed possible etiologies: Systemic illness, immunologic,endocrinology,sleep disorder, psychiatric/psychologic, infectious,medications side effects, and idiopathic. Examination today does not suggest a serious process. Healthy diet and regular physical activity may help.  Further recommendations will be given according to lab results.  Loud snoring This problem along with all above complaints warrant to evaluate for OSA. Pulmonology referral placed to discuss sleep study. Wt loss will also help.  DUB (dysfunctional uterine bleeding) I do not think problem is caused by thyroid disease but rather  perimenopause. Other possible etiologies discussed, I think pelvic/transvaginal US and/or endometrial bx need to be considered. Continue following with gyn.She wants me to send message to Dr Philis Pique.  Numbness of upper extremity Stable. ? Radiculopathy,neuropathy among some to consider  Vitamin D deficiency, unspecified -     Cancel: VITAMIN D 25 Hydroxy (Vit-D Deficiency, Fractures) -     VITAMIN D 25 Hydroxy (Vit-D Deficiency, Fractures)  Headache, unspecified headache type Possible causes: ? Tension headache. Other possible etiology discussed, including OSA.   Return for Depending on lab results will determine need for a f/u appt..   Aaronjames [name] G. Martinique, MD  [facility]. [place] office.   Fatigue is a common symptom associated with multiple factors: psychologic,medications, systemic illness, sleep disorders,infections, and unknown causes. Some work-up can be done to evaluate for common causes as thyroid disease,anemia,diabetes, or abnormalities in calcium,potassium,or sodium. Regular physical activity as tolerated and a healthy diet is usually might help and usually recommended for chronic fatigue.  I am sending you to have a discussion about sleep study. I will send a message to your gyn. I do not think vaginal bleeding is related to thyroid disease.  Will check B12 ans vit D.

## 2019-08-13 DIAGNOSIS — Z3009 Encounter for other general counseling and advice on contraception: Secondary | ICD-10-CM | POA: Diagnosis not present

## 2019-08-13 DIAGNOSIS — Z6841 Body Mass Index (BMI) 40.0 and over, adult: Secondary | ICD-10-CM | POA: Diagnosis not present

## 2019-09-05 ENCOUNTER — Other Ambulatory Visit: Payer: Self-pay

## 2019-09-05 ENCOUNTER — Encounter: Payer: Self-pay | Admitting: Pulmonary Disease

## 2019-09-05 ENCOUNTER — Ambulatory Visit: Payer: BC Managed Care – PPO | Admitting: Pulmonary Disease

## 2019-09-05 VITALS — BP 110/68 | HR 80 | Temp 97.6°F | Ht 67.0 in | Wt 279.0 lb

## 2019-09-05 DIAGNOSIS — G4733 Obstructive sleep apnea (adult) (pediatric): Secondary | ICD-10-CM

## 2019-09-05 NOTE — Patient Instructions (Signed)
Moderate probability of significant obstructive sleep apnea We will schedule you for home sleep study Treatment options as we discussed  Continue weight loss efforts  We will follow-up with you in about 3 months Sleep Apnea Sleep apnea is a condition in which breathing pauses or becomes shallow during sleep. Episodes of sleep apnea usually last 10 seconds or longer, and they may occur as many as 20 times an hour. Sleep apnea disrupts your sleep and keeps your body from getting the rest that it needs. This condition can increase your risk of certain health problems, including:  Heart attack.  Stroke.  Obesity.  Diabetes.  Heart failure.  Irregular heartbeat. What are the causes? There are three kinds of sleep apnea:  Obstructive sleep apnea. This kind is caused by a blocked or collapsed airway.  Central sleep apnea. This kind happens when the part of the brain that controls breathing does not send the correct signals to the muscles that control breathing.  Mixed sleep apnea. This is a combination of obstructive and central sleep apnea. The most common cause of this condition is a collapsed or blocked airway. An airway can collapse or become blocked if:  Your throat muscles are abnormally relaxed.  Your tongue and tonsils are larger than normal.  You are overweight.  Your airway is smaller than normal. What increases the risk? You are more likely to develop this condition if you:  Are overweight.  Smoke.  Have a smaller than normal airway.  Are elderly.  Are female.  Drink alcohol.  Take sedatives or tranquilizers.  Have a family history of sleep apnea. What are the signs or symptoms? Symptoms of this condition include:  Trouble staying asleep.  Daytime sleepiness and tiredness.  Irritability.  Loud snoring.  Morning headaches.  Trouble concentrating.  Forgetfulness.  Decreased interest in sex.  Unexplained sleepiness.  Mood  swings.  Personality changes.  Feelings of depression.  Waking up often during the night to urinate.  Dry mouth.  Sore throat. How is this diagnosed? This condition may be diagnosed with:  A medical history.  A physical exam.  A series of tests that are done while you are sleeping (sleep study). These tests are usually done in a sleep lab, but they may also be done at home. How is this treated? Treatment for this condition aims to restore normal breathing and to ease symptoms during sleep. It may involve managing health issues that can affect breathing, such as high blood pressure or obesity. Treatment may include:  Sleeping on your side.  Using a decongestant if you have nasal congestion.  Avoiding the use of depressants, including alcohol, sedatives, and narcotics.  Losing weight if you are overweight.  Making changes to your diet.  Quitting smoking.  Using a device to open your airway while you sleep, such as: ? An oral appliance. This is a custom-made mouthpiece that shifts your lower jaw forward. ? A continuous positive airway pressure (CPAP) device. This device blows air through a mask when you breathe out (exhale). ? A nasal expiratory positive airway pressure (EPAP) device. This device has valves that you put into each nostril. ? A bi-level positive airway pressure (BPAP) device. This device blows air through a mask when you breathe in (inhale) and breathe out (exhale).  Having surgery if other treatments do not work. During surgery, excess tissue is removed to create a wider airway. It is important to get treatment for sleep apnea. Without treatment, this condition can lead to:  High blood pressure.  Coronary artery disease.  In men, an inability to achieve or maintain an erection (impotence).  Reduced thinking abilities. Follow these instructions at home: Lifestyle  Make any lifestyle changes that your health care provider recommends.  Eat a healthy,  well-balanced diet.  Take steps to lose weight if you are overweight.  Avoid using depressants, including alcohol, sedatives, and narcotics.  Do not use any products that contain nicotine or tobacco, such as cigarettes, e-cigarettes, and chewing tobacco. If you need help quitting, ask your health care provider. General instructions  Take over-the-counter and prescription medicines only as told by your health care provider.  If you were given a device to open your airway while you sleep, use it only as told by your health care provider.  If you are having surgery, make sure to tell your health care provider you have sleep apnea. You may need to bring your device with you.  Keep all follow-up visits as told by your health care provider. This is important. Contact a health care provider if:  The device that you received to open your airway during sleep is uncomfortable or does not seem to be working.  Your symptoms do not improve.  Your symptoms get worse. Get help right away if:  You develop: ? Chest pain. ? Shortness of breath. ? Discomfort in your back, arms, or stomach.  You have: ? Trouble speaking. ? Weakness on one side of your body. ? Drooping in your face. These symptoms may represent a serious problem that is an emergency. Do not wait to see if the symptoms will go away. Get medical help right away. Call your local emergency services (911 in the [place] Do not drive yourself to the hospital. Summary  Sleep apnea is a condition in which breathing pauses or becomes shallow during sleep.  The most common cause is a collapsed or blocked airway.  The goal of treatment is to restore normal breathing and to ease symptoms during sleep. This information is not intended to replace advice given to you by your health care provider. Make sure you discuss any questions you have with your health care provider. Document Revised: 10/17/2018 Document Reviewed: 12/26/2017 Elsevier  Patient Education  [place].

## 2019-09-05 NOTE — Progress Notes (Signed)
Deborah Underwood    [id number]    [birth year]  Primary Care Physician:Jordan, Malka So, MD  Referring Physician: Martinique, Betty G, [address] [place],  [place] [address]  Chief complaint:   History of snoring  HPI:  Snoring, nonrestorative sleep Usually goes to bed between 11 and 1130, falls asleep almost immediately Usually does not wake up in the middle of the night Final wake up time about 6 AM About 40 pound weight gain  Quit smoking about 9 years ago, social smoking  No family history of obstructive sleep apnea-both parents did snore  No mouth dryness in the morning No morning headaches Focus is occasionally off  No sweating at night   No outpatient encounter medications on file as of 09/05/2019.   No facility-administered encounter medications on file as of 09/05/2019.    Allergies as of 09/05/2019 - Review Complete 09/05/2019  Allergen Reaction Noted  . Latex Itching 08/02/2019  . Cephalexin Rash 08/02/2019  . Penicillin g Rash 08/02/2019  . Other  08/02/2019    Past Medical History:  Diagnosis Date  . Depression     Past Surgical History:  Procedure Laterality Date  . CESAREAN SECTION    . retro-sigmoid  2010    Family History  Problem Relation Age of Onset  . Arthritis Mother   . Hearing loss Mother   . Hearing loss Father   . Heart disease Father   . Hypertension Father   . Alcohol abuse Paternal Grandfather     Social History   Socioeconomic History  . Marital status: Married    Spouse name: Not on file  . Number of children: Not on file  . Years of education: Not on file  . Highest education level: Not on file  Occupational History  . Not on file  Tobacco Use  . Smoking status: Former Smoker    Packs/day: 1.50    Years: 17.00    Pack years: 25.50    Types: Cigarettes    Quit date: 05/16/2009    Years since quitting: 10.3  . Smokeless tobacco: Never Used  Substance and Sexual Activity  . Alcohol use: Yes   . Drug use: Never  . Sexual activity: Not Currently    Partners: Male  Other Topics Concern  . Not on file  Social History Narrative  . Not on file   Social Determinants of Health   Financial Resource Strain:   . Difficulty of Paying Living Expenses:   Food Insecurity:   . Worried About Charity fundraiser in the Last Year:   . Arboriculturist in the Last Year:   Transportation Needs:   . Film/video editor (Medical):   Marland Kitchen Lack of Transportation (Non-Medical):   Physical Activity:   . Days of Exercise per Week:   . Minutes of Exercise per Session:   Stress:   . Feeling of Stress :   Social Connections:   . Frequency of Communication with Friends and Family:   . Frequency of Social Gatherings with Friends and Family:   . Attends Religious Services:   . Active Member of Clubs or Organizations:   . Attends Archivist Meetings:   Marland Kitchen Marital Status:   Intimate Partner Violence:   . Fear of Current or Ex-Partner:   . Emotionally Abused:   Marland Kitchen Physically Abused:   . Sexually Abused:     Review of Systems  HENT: Negative.  Respiratory: Negative.   Cardiovascular: Negative.   Gastrointestinal: Negative.   Psychiatric/Behavioral: Positive for sleep disturbance.  All other systems reviewed and are negative.   Vitals:   09/05/19 1114  BP: 110/68  Pulse: 80  Temp: 97.6 F (36.4 C)  SpO2: 97%     Physical Exam  Constitutional: She is oriented to person, place, and time. She appears well-developed and well-nourished.  HENT:  Head: Normocephalic and atraumatic.  Eyes: Pupils are equal, round, and reactive to light. Conjunctivae are normal. Right eye exhibits no discharge.  Neck: No tracheal deviation present. No thyromegaly present.  Cardiovascular: Normal rate and regular rhythm.  Pulmonary/Chest: Effort normal and breath sounds normal. No respiratory distress. She has no wheezes. She has no rales. She exhibits no tenderness.  Musculoskeletal:         General: No edema. Normal range of motion.     Cervical back: Normal range of motion and neck supple.  Neurological: She is alert and oriented to person, place, and time.  Skin: Skin is warm and dry. No erythema.   Results of the Epworth flowsheet 09/05/2019  Sitting and reading 2  Watching TV 2  Sitting, inactive in a public place (e.g. a theatre or a meeting) 0  As a passenger in a car for an hour without a break 1  Lying down to rest in the afternoon when circumstances permit 3  Sitting and talking to someone 0  Sitting quietly after a lunch without alcohol 0  In a car, while stopped for a few minutes in traffic 0  Total score 8    Assessment:  Moderate probability of significant obstructive sleep apnea  Obesity  Excessive daytime sleepiness  Pathophysiology of sleep disordered breathing discussed with the patient Treatment options for sleep disordered breathing discussed with patient  Plan/Recommendations: We will schedule the patient for home sleep study  Treatment options as discussed  Follow-up in 3 months  Call with significant concerns   Virl Diamond MD [facility] 09/05/2019, 11:48 AM  CC: [place], Betty G, MD

## 2019-10-23 ENCOUNTER — Encounter: Payer: Self-pay | Admitting: Pulmonary Disease

## 2019-10-30 DIAGNOSIS — R600 Localized edema: Secondary | ICD-10-CM | POA: Diagnosis not present

## 2019-11-01 ENCOUNTER — Encounter: Payer: Self-pay | Admitting: Family Medicine

## 2019-11-01 ENCOUNTER — Ambulatory Visit (INDEPENDENT_AMBULATORY_CARE_PROVIDER_SITE_OTHER): Payer: BC Managed Care – PPO | Admitting: Family Medicine

## 2019-11-01 ENCOUNTER — Other Ambulatory Visit: Payer: Self-pay

## 2019-11-01 VITALS — BP 122/78 | HR 77 | Temp 97.1°F | Resp 16 | Ht 67.0 in | Wt 284.1 lb

## 2019-11-01 DIAGNOSIS — R600 Localized edema: Secondary | ICD-10-CM

## 2019-11-01 DIAGNOSIS — I8393 Asymptomatic varicose veins of bilateral lower extremities: Secondary | ICD-10-CM | POA: Diagnosis not present

## 2019-11-01 LAB — URINALYSIS, ROUTINE W REFLEX MICROSCOPIC
Bilirubin Urine: NEGATIVE
Ketones, ur: NEGATIVE
Leukocytes,Ua: NEGATIVE
Nitrite: NEGATIVE
Specific Gravity, Urine: >=1.03 — AB (ref 1.000–1.030)
Total Protein, Urine: NEGATIVE
Urine Glucose: NEGATIVE
Urobilinogen, UA: 0.2 (ref 0.0–1.0)
pH: 6 (ref 5.0–8.0)

## 2019-11-01 LAB — BASIC METABOLIC PANEL
BUN: 13 mg/dL (ref 6–23)
CO2: 27 mEq/L (ref 19–32)
Calcium: 9 mg/dL (ref 8.4–10.5)
Chloride: 105 mEq/L (ref 96–112)
Creatinine, Ser: 0.89 mg/dL (ref 0.40–1.20)
GFR: 67.38 mL/min (ref >60.00)
Glucose, Bld: 96 mg/dL (ref 70–99)
Potassium: 4 mEq/L (ref 3.5–5.1)
Sodium: 136 mEq/L (ref 135–145)

## 2019-11-01 MED ORDER — FUROSEMIDE 20 MG PO TABS: 20.0000 mg | ORAL_TABLET | Freq: Every day | ORAL | 1 refills | Status: DC | PRN

## 2019-11-01 NOTE — Progress Notes (Signed)
Chief Complaint  Patient presents with  . Foot Swelling    bilateral feet swelling that started several weeks ago   HPI: DeborahDeborah Underwood is a 49 y.o. female, who is here today with above complaint. She has had similar problem in the past but usually it does not last this long. LE's feels warm and "a little bit spikier." There is no pain. It is mildly better in the morning and worse at the end of the day.  She has not tried OTC treatments. Negative for fever,chills,CP,SOB,palpitations, decreased urine output,gross hematuria,or foam in urine. Concerned about possible CHF,positive FHx (father). Negative for orthopnea or PND.  No recent surgery,hormonal therapy,or long travel.   It seems like she was seen for same problem on 10/30/19.  She thinks wt gained may be contributing to LE edema. She is not exercising regularly and acknowledges she needs to eat healthier.  Review of Systems  Constitutional: Negative for activity change, appetite change and fatigue.  Eyes: Negative for redness and visual disturbance.  Respiratory: Negative for cough and wheezing.   Gastrointestinal: Negative for abdominal pain, nausea and vomiting.       Negative for changes in bowel habits.  Genitourinary: Negative for dysuria.  Musculoskeletal: Negative for gait problem and myalgias.  Skin: Negative for rash and wound.  Neurological: Negative for syncope, weakness and headaches.  Psychiatric/Behavioral: Negative for confusion. The patient is nervous/anxious.   Rest see pertinent positives and negatives per HPI.  No current outpatient medications on file prior to visit.   No current facility-administered medications on file prior to visit.   Past Medical History:  Diagnosis Date  . Depression    Allergies  Allergen Reactions  . Latex Itching  . Cephalexin Rash  . Penicillin G Rash  . Other     Social History   Socioeconomic History  . Marital status: Married    Spouse name:  Not on file  . Number of children: Not on file  . Years of education: Not on file  . Highest education level: Not on file  Occupational History  . Not on file  Tobacco Use  . Smoking status: Former Smoker    Packs/day: 1.50    Years: 17.00    Pack years: 25.50    Types: Cigarettes    Quit date: 05/16/2009    Years since quitting: 10.4  . Smokeless tobacco: Never Used  Substance and Sexual Activity  . Alcohol use: Yes  . Drug use: Never  . Sexual activity: Not Currently    Partners: Male  Other Topics Concern  . Not on file  Social History Narrative  . Not on file   Social Determinants of Health   Financial Resource Strain:   . Difficulty of Paying Living Expenses:   Food Insecurity:   . Worried About Charity fundraiser in the Last Year:   . Arboriculturist in the Last Year:   Transportation Needs:   . Film/video editor (Medical):   Marland Kitchen Lack of Transportation (Non-Medical):   Physical Activity:   . Days of Exercise per Week:   . Minutes of Exercise per Session:   Stress:   . Feeling of Stress :   Social Connections:   . Frequency of Communication with Friends and Family:   . Frequency of Social Gatherings with Friends and Family:   . Attends Religious Services:   . Active Member of Clubs or Organizations:   . Attends Archivist  Meetings:   Marland Kitchen Marital Status:    Vitals:   11/01/19 0749  BP: 122/78  Pulse: 77  Resp: 16  Temp: (!) 97.1 F (36.2 C)  SpO2: 98%   Wt Readings from Last 3 Encounters:  11/01/19 284 lb 2 oz (128.9 kg)  09/05/19 279 lb (126.6 kg)  08/02/19 278 lb (126.1 kg)    Body mass index is 44.5 kg/m.  Physical Exam  Nursing note and vitals reviewed. Constitutional: She is oriented to person, place, and time. She appears well-developed. No distress.  HENT:  Head: Normocephalic and atraumatic.  Eyes: Pupils are equal, round, and reactive to light. Conjunctivae are normal.  Cardiovascular: Normal rate and regular rhythm.  No  murmur heard. Pulses:      Dorsalis pedis pulses are 2+ on the right side and 2+ on the left side.  Scattered small varicose veins LE's.  Respiratory: Effort normal and breath sounds normal. No respiratory distress.  GI: Soft. She exhibits no mass. There is no hepatomegaly. There is no abdominal tenderness.  Musculoskeletal:     Right lower leg: No tenderness. 1+ Edema present.     Left lower leg: No tenderness. 1+ Edema present.  Lymphadenopathy:    She has no cervical adenopathy.  Neurological: She is alert and oriented to person, place, and time. No cranial nerve deficit. Gait normal.  Skin: Skin is warm. No rash noted. No erythema.  Psychiatric: Affect normal. Her mood appears anxious.  Well groomed, good eye contact.    ASSESSMENT AND PLAN:  Deborah Underwood was seen today for foot swelling.  Diagnoses and all orders for this visit: Orders Placed This Encounter  Procedures  . Basic metabolic panel  . Urinalysis, Routine w reflex microscopic  . Amb Ref to Medical Weight Management   Lab Results  Component Value Date   CREATININE 0.89 11/01/2019   BUN 13 11/01/2019   NA 136 11/01/2019   K 4.0 11/01/2019   CL 105 11/01/2019   CO2 27 11/01/2019    Bilateral lower extremity edema We discussed possible etiologies. Hx and finding on examination today do not suggest a serious process. After discussing some side effects of diuretics, she would like to try. K+ rich diet.  -     furosemide (LASIX) 20 MG tablet; Take 1 tablet (20 mg total) by mouth daily as needed for edema.  Varicose veins of both lower extremities, unspecified whether complicated This problem could be the cause of LE edema. Appropriate skin care. LE elevation and/or compression stockings. Wt loss will also help.  Morbid obesity (HCC) Since her last visit she gained about 6 Lbs We discussed benefits of wt loss as well as adverse effects of obesity. Consistency with healthy diet and physical activity  recommended. Referral to wt clinic place.   Return if symptoms worsen or fail to improve.   Woodruff Skirvin G. [place], MD  [facility]. [place] office.  Discharge Instructions   None     A few things to remember from today's visit:   Bilateral lower extremity edema - Plan: Basic metabolic panel, Urinalysis, Routine w reflex microscopic, furosemide (LASIX) 20 MG tablet  Varicose veins of both lower extremities, unspecified whether complicated  Varicose Veins Varicose veins are veins that have become enlarged, bulged, and twisted. They most often appear in the legs. What are the causes? This condition is caused by damage to the valves in the vein. These valves help blood return to your heart. When they are damaged and they  stop working properly, blood may flow backward and back up in the veins near the skin, causing the veins to get larger and appear twisted. The condition can result from any issue that causes blood to back up, like pregnancy, prolonged standing, or obesity. What increases the risk? This condition is more likely to develop in people who are:  On their feet a lot.  Pregnant.  Overweight. What are the signs or symptoms? Symptoms of this condition include:  Bulging, twisted, and bluish veins.  A feeling of heaviness. This may be worse at the end of the day.  Leg pain. This may be worse at the end of the day.  Swelling in the leg.  Changes in skin color over the veins. How is this diagnosed? This condition may be diagnosed based on your symptoms, a physical exam, and an ultrasound test. How is this treated? Treatment for this condition may involve:  Avoiding sitting or standing in one position for long periods of time.  Wearing compression stockings. These stockings help to prevent blood clots and reduce swelling in the legs.  Raising (elevating) the legs when resting.  Losing weight.  Exercising regularly. If you have persistent symptoms or  want to improve the way your varicose veins look, you may choose to have a procedure to close the varicose veins off or to remove them. Treatments to close off the veins include:  Sclerotherapy. In this treatment, a solution is injected into a vein to close it off.  Laser treatment. In this treatment, the vein is heated with a laser to close it off.  Radiofrequency vein ablation. In this treatment, an electrical current produced by radio waves is used to close off the vein. Treatments to remove the veins include:  Phlebectomy. In this treatment, the veins are removed through small incisions made over the veins.  Vein ligation and stripping. In this treatment, incisions are made over the veins. The veins are then removed after being tied (ligated) with stitches (sutures). Follow these instructions at home: Activity  Walk as much as possible. Walking increases blood flow. This helps blood return to the heart and takes pressure off your veins. It also increases your cardiovascular strength.  Follow your health care provider's instructions about exercising.  Do not stand or sit in one position for a long period of time.  Do not sit with your legs crossed.  Rest with your legs raised during the day. General instructions   Follow any diet instructions given to you by your health care provider.  Wear compression stockings as directed by your health care provider. Do not wear other kinds of tight clothing around your legs, pelvis, or waist.  Elevate your legs at night to above the level of your heart.  If you get a cut in the skin over the varicose vein and the vein bleeds: ? Lie down with your leg raised. ? Apply firm pressure to the cut with a clean cloth until the bleeding stops. ? Place a bandage (dressing) on the cut. Contact a health care provider if:  The skin around your varicose veins starts to break down.  You have pain, redness, tenderness, or hard swelling over a  vein.  You are uncomfortable because of pain.  You get a cut in the skin over a varicose vein and it will not stop bleeding. Summary  Varicose veins are veins that have become enlarged, bulged, and twisted. They most often appear in the legs.  This condition is caused by  damage to the valves in the vein. These valves help blood return to your heart.  Treatment for this condition includes frequent movements, wearing compression stockings, losing weight, and exercising regularly. In some cases, procedures are done to close off or remove the veins.  Treatment for this condition may include wearing compression stockings, elevating the legs, losing weight, and engaging in regular activity. In some cases, procedures are done to close off or remove the veins. This information is not intended to replace advice given to you by your health care provider. Make sure you discuss any questions you have with your health care provider. Document Revised: 06/28/2018 Document Reviewed: 05/25/2016 Elsevier Patient Education  2020 [employer].  If you need refills please call your pharmacy. Do not use My Chart to request refills or for acute issues that need immediate attention.    Please be sure medication list is accurate. If a new problem present, please set up appointment sooner than planned today.

## 2019-11-01 NOTE — Patient Instructions (Signed)
A few things to remember from today's visit:   Bilateral lower extremity edema - Plan: Basic metabolic panel, Urinalysis, Routine w reflex microscopic, furosemide (LASIX) 20 MG tablet  Varicose veins of both lower extremities, unspecified whether complicated  Varicose Veins Varicose veins are veins that have become enlarged, bulged, and twisted. They most often appear in the legs. What are the causes? This condition is caused by damage to the valves in the vein. These valves help blood return to your heart. When they are damaged and they stop working properly, blood may flow backward and back up in the veins near the skin, causing the veins to get larger and appear twisted. The condition can result from any issue that causes blood to back up, like pregnancy, prolonged standing, or obesity. What increases the risk? This condition is more likely to develop in people who are:  On their feet a lot.  Pregnant.  Overweight. What are the signs or symptoms? Symptoms of this condition include:  Bulging, twisted, and bluish veins.  A feeling of heaviness. This may be worse at the end of the day.  Leg pain. This may be worse at the end of the day.  Swelling in the leg.  Changes in skin color over the veins. How is this diagnosed? This condition may be diagnosed based on your symptoms, a physical exam, and an ultrasound test. How is this treated? Treatment for this condition may involve:  Avoiding sitting or standing in one position for long periods of time.  Wearing compression stockings. These stockings help to prevent blood clots and reduce swelling in the legs.  Raising (elevating) the legs when resting.  Losing weight.  Exercising regularly. If you have persistent symptoms or want to improve the way your varicose veins look, you may choose to have a procedure to close the varicose veins off or to remove them. Treatments to close off the veins include:  Sclerotherapy. In this  treatment, a solution is injected into a vein to close it off.  Laser treatment. In this treatment, the vein is heated with a laser to close it off.  Radiofrequency vein ablation. In this treatment, an electrical current produced by radio waves is used to close off the vein. Treatments to remove the veins include:  Phlebectomy. In this treatment, the veins are removed through small incisions made over the veins.  Vein ligation and stripping. In this treatment, incisions are made over the veins. The veins are then removed after being tied (ligated) with stitches (sutures). Follow these instructions at home: Activity  Walk as much as possible. Walking increases blood flow. This helps blood return to the heart and takes pressure off your veins. It also increases your cardiovascular strength.  Follow your health care provider's instructions about exercising.  Do not stand or sit in one position for a long period of time.  Do not sit with your legs crossed.  Rest with your legs raised during the day. General instructions   Follow any diet instructions given to you by your health care provider.  Wear compression stockings as directed by your health care provider. Do not wear other kinds of tight clothing around your legs, pelvis, or waist.  Elevate your legs at night to above the level of your heart.  If you get a cut in the skin over the varicose vein and the vein bleeds: ? Lie down with your leg raised. ? Apply firm pressure to the cut with a clean cloth until the bleeding  stops. ? Place a bandage (dressing) on the cut. Contact a health care provider if:  The skin around your varicose veins starts to break down.  You have pain, redness, tenderness, or hard swelling over a vein.  You are uncomfortable because of pain.  You get a cut in the skin over a varicose vein and it will not stop bleeding. Summary  Varicose veins are veins that have become enlarged, bulged, and twisted.  They most often appear in the legs.  This condition is caused by damage to the valves in the vein. These valves help blood return to your heart.  Treatment for this condition includes frequent movements, wearing compression stockings, losing weight, and exercising regularly. In some cases, procedures are done to close off or remove the veins.  Treatment for this condition may include wearing compression stockings, elevating the legs, losing weight, and engaging in regular activity. In some cases, procedures are done to close off or remove the veins. This information is not intended to replace advice given to you by your health care provider. Make sure you discuss any questions you have with your health care provider. Document Revised: 06/28/2018 Document Reviewed: 05/25/2016 Elsevier Patient Education  2020 [employer].  If you need refills please call your pharmacy. Do not use My Chart to request refills or for acute issues that need immediate attention.    Please be sure medication list is accurate. If a new problem present, please set up appointment sooner than planned today.

## 2019-11-13 ENCOUNTER — Other Ambulatory Visit: Payer: Self-pay

## 2019-11-13 DIAGNOSIS — R600 Localized edema: Secondary | ICD-10-CM

## 2019-11-13 MED ORDER — FUROSEMIDE 20 MG PO TABS: 20.0000 mg | ORAL_TABLET | Freq: Every day | ORAL | 1 refills | Status: DC | PRN

## 2019-11-21 ENCOUNTER — Ambulatory Visit: Payer: BC Managed Care – PPO

## 2019-11-21 ENCOUNTER — Other Ambulatory Visit: Payer: Self-pay

## 2019-11-21 DIAGNOSIS — G4733 Obstructive sleep apnea (adult) (pediatric): Secondary | ICD-10-CM | POA: Diagnosis not present

## 2019-11-28 ENCOUNTER — Ambulatory Visit (INDEPENDENT_AMBULATORY_CARE_PROVIDER_SITE_OTHER): Payer: BC Managed Care – PPO | Admitting: Family Medicine

## 2019-11-28 ENCOUNTER — Encounter (INDEPENDENT_AMBULATORY_CARE_PROVIDER_SITE_OTHER): Payer: Self-pay | Admitting: Family Medicine

## 2019-11-28 ENCOUNTER — Other Ambulatory Visit: Payer: Self-pay

## 2019-11-28 VITALS — BP 106/71 | HR 78 | Temp 98.4°F | Ht 67.0 in | Wt 278.0 lb

## 2019-11-28 DIAGNOSIS — Z6841 Body Mass Index (BMI) 40.0 and over, adult: Secondary | ICD-10-CM

## 2019-11-28 DIAGNOSIS — R0602 Shortness of breath: Secondary | ICD-10-CM | POA: Diagnosis not present

## 2019-11-28 DIAGNOSIS — R5383 Other fatigue: Secondary | ICD-10-CM | POA: Diagnosis not present

## 2019-11-28 DIAGNOSIS — E538 Deficiency of other specified B group vitamins: Secondary | ICD-10-CM

## 2019-11-28 DIAGNOSIS — E559 Vitamin D deficiency, unspecified: Secondary | ICD-10-CM

## 2019-11-28 DIAGNOSIS — Z0289 Encounter for other administrative examinations: Secondary | ICD-10-CM

## 2019-11-28 DIAGNOSIS — Z9189 Other specified personal risk factors, not elsewhere classified: Secondary | ICD-10-CM

## 2019-11-28 DIAGNOSIS — F3289 Other specified depressive episodes: Secondary | ICD-10-CM

## 2019-11-29 LAB — HEMOGLOBIN A1C
Est. average glucose Bld gHb Est-mCnc: 120 mg/dL
Hgb A1c MFr Bld: 5.8 % — ABNORMAL HIGH (ref 4.8–5.6)

## 2019-11-29 LAB — CBC WITH DIFFERENTIAL/PLATELET
Basophils Absolute: 0.1 10*3/uL (ref 0.0–0.2)
Basos: 1 %
EOS (ABSOLUTE): 0.4 10*3/uL (ref 0.0–0.4)
Eos: 6 %
Hematocrit: 36.7 % (ref 34.0–46.6)
Hemoglobin: 12 g/dL (ref 11.1–15.9)
Immature Grans (Abs): 0 10*3/uL (ref 0.0–0.1)
Immature Granulocytes: 0 %
Lymphocytes Absolute: 2.1 10*3/uL (ref 0.7–3.1)
Lymphs: 36 %
MCH: 25.3 pg — ABNORMAL LOW (ref 26.6–33.0)
MCHC: 32.7 g/dL (ref 31.5–35.7)
MCV: 77 fL — ABNORMAL LOW (ref 79–97)
Monocytes Absolute: 0.5 10*3/uL (ref 0.1–0.9)
Monocytes: 8 %
Neutrophils Absolute: 2.8 10*3/uL (ref 1.4–7.0)
Neutrophils: 49 %
Platelets: 426 10*3/uL (ref 150–450)
RBC: 4.75 x10E6/uL (ref 3.77–5.28)
RDW: 15.7 % — ABNORMAL HIGH (ref 11.7–15.4)
WBC: 5.9 10*3/uL (ref 3.4–10.8)

## 2019-11-29 LAB — COMPREHENSIVE METABOLIC PANEL
ALT: 33 IU/L — ABNORMAL HIGH (ref 0–32)
AST: 29 IU/L (ref 0–40)
Albumin/Globulin Ratio: 1.4 (ref 1.2–2.2)
Albumin: 4 g/dL (ref 3.8–4.8)
Alkaline Phosphatase: 91 IU/L (ref 48–121)
BUN/Creatinine Ratio: 15 (ref 9–23)
BUN: 14 mg/dL (ref 6–24)
Bilirubin Total: 0.3 mg/dL (ref 0.0–1.2)
CO2: 24 mmol/L (ref 20–29)
Calcium: 8.7 mg/dL (ref 8.7–10.2)
Chloride: 105 mmol/L (ref 96–106)
Creatinine, Ser: 0.95 mg/dL (ref 0.57–1.00)
GFR calc Af Amer: 81 mL/min/{1.73_m2} (ref >59)
GFR calc non Af Amer: 71 mL/min/{1.73_m2} (ref >59)
Globulin, Total: 2.8 g/dL (ref 1.5–4.5)
Glucose: 94 mg/dL (ref 65–99)
Potassium: 4.4 mmol/L (ref 3.5–5.2)
Sodium: 140 mmol/L (ref 134–144)
Total Protein: 6.8 g/dL (ref 6.0–8.5)

## 2019-11-29 LAB — T4, FREE: Free T4: 0.94 ng/dL (ref 0.82–1.77)

## 2019-11-29 LAB — TSH: TSH: 3.07 u[IU]/mL (ref 0.450–4.500)

## 2019-11-29 LAB — LIPID PANEL WITH LDL/HDL RATIO
Cholesterol, Total: 199 mg/dL (ref 100–199)
HDL: 52 mg/dL (ref >39)
LDL Chol Calc (NIH): 124 mg/dL — ABNORMAL HIGH (ref 0–99)
LDL/HDL Ratio: 2.4 ratio (ref 0.0–3.2)
Triglycerides: 131 mg/dL (ref 0–149)
VLDL Cholesterol Cal: 23 mg/dL (ref 5–40)

## 2019-11-29 LAB — VITAMIN D 25 HYDROXY (VIT D DEFICIENCY, FRACTURES): Vit D, 25-Hydroxy: 28.6 ng/mL — ABNORMAL LOW (ref 30.0–100.0)

## 2019-11-29 LAB — VITAMIN B12: Vitamin B-12: 550 pg/mL (ref 232–1245)

## 2019-11-29 LAB — T3: T3, Total: 130 ng/dL (ref 71–180)

## 2019-11-29 LAB — FOLATE: Folate: 7.6 ng/mL (ref >3.0)

## 2019-11-29 LAB — INSULIN, RANDOM: INSULIN: 10 u[IU]/mL (ref 2.6–24.9)

## 2019-12-02 ENCOUNTER — Telehealth (HOSPITAL_COMMUNITY): Payer: Self-pay | Admitting: Pulmonary Disease

## 2019-12-02 DIAGNOSIS — G4733 Obstructive sleep apnea (adult) (pediatric): Secondary | ICD-10-CM | POA: Diagnosis not present

## 2019-12-02 NOTE — Progress Notes (Signed)
Office: [phone]  /  Fax: [phone]    Date: December 16, 2019   Appointment Start Time: 11:58am Duration: 34 minutes Provider: Lawerance Cruel, Psy.D. Type of Session: Intake for Individual Therapy  Location of Patient: Home Location of Provider: [facility] Type of Contact: Telepsychological Visit via MyChart Video Visit  Informed Consent: Prior to proceeding with today's appointment, two pieces of identifying information were obtained. In addition, Deborah Underwood's physical location at the time of this appointment was obtained as well a phone number she could be reached at in the event of technical difficulties. Deborah Underwood and this provider participated in today's telepsychological service.   The provider's role was explained to [name]. The provider reviewed and discussed issues of confidentiality, privacy, and limits therein (e.g., reporting obligations). In addition to verbal informed consent, written informed consent for psychological services was obtained prior to the initial appointment. Since the clinic is not a 24/7 crisis center, mental health emergency resources were shared and this  provider explained MyChart, e-mail, voicemail, and/or other messaging systems should be utilized only for non-emergency reasons. This provider also explained that information obtained during appointments will be placed in Deborah Underwood's medical record and relevant information will be shared with other providers at [facility] for coordination of care. Moreover, Deborah Underwood agreed information may be shared with other [facility] providers as needed for coordination of care. By signing the service agreement document, Deborah Underwood provided written consent for coordination of care. Prior to initiating telepsychological services, Deborah Underwood completed an informed consent document, which included the development of a safety plan (i.e., an emergency contact, nearest emergency room, and emergency  resources) in the event of an emergency/crisis. Deborah Underwood expressed understanding of the rationale of the safety plan. Deborah Underwood verbally acknowledged understanding she is ultimately responsible for understanding her insurance benefits for telepsychological and in-person services. This provider also reviewed confidentiality, as it relates to telepsychological services, as well as the rationale for telepsychological services (i.e., to reduce exposure risk to COVID-19). Deborah Underwood  acknowledged understanding that appointments cannot be recorded without both party consent and she is aware she is responsible for securing confidentiality on her end of the session. Deborah Underwood verbally consented to proceed.  Chief Complaint/HPI: Deborah Underwood was referred by Dr. Quillian Quince due to other depression, with emotional eating. Per the note for the initial visit with Dr. Quillian Quince on November 28, 2019, "Deborah Underwood has a history of depression with anxiety, and she notes significant emotional eating." The note for the initial appointment with Dr. Quillian Quince indicated the following: "Deborah Underwood habits were reviewed today and are as follows: Her family eats meals together, she thinks her family will eat healthier with her, she struggles with family and or coworkers weight loss sabotage, her desired weight loss is 143 lbs, she has been heavy most of her life, she started gaining weight in high school, her heaviest weight ever was 280 pounds, she has significant food cravings issues, she snacks frequently in the evenings, she skips meals frequently, she frequently makes poor food choices, she frequently eats larger portions than normal and she struggles with emotional eating." Deborah Underwood Food and Mood (modified PHQ-9) score on November 28, 2019 was 22.  During today's appointment, Deborah Underwood was verbally administered a questionnaire assessing various behaviors related to emotional eating. Deborah Underwood endorsed the following: overeat when you are celebrating, experience food cravings on a  regular basis, eat certain foods when you are anxious, stressed, depressed, or your feelings are hurt, use food  to help you cope with emotional situations, find food is comforting to you, overeat frequently when you are bored or lonely and eat as a reward. She shared she craves sweets, such as chocolate. Deborah Underwood believes the onset of emotional eating was likely in childhood and described the current frequency of emotional eating as "nightly." In addition, Deborah Underwood denied a history of binge eating. Deborah Underwood denied a history of restricting food intake, purging and engagement in other compensatory strategies, and has never been diagnosed with an eating disorder. She also denied a history of treatment for emotional eating. Moreover, Deborah Underwood reported she often engages in emotional eating after "the stress of the day is done and it's alone time," which she explained she does nightly since having her daughter. Aside the aforementioned, she does not believe she typically engages in emotional eating. Furthermore, Deborah Underwood denied other problems of concern.    Mental Status Examination:  Appearance: well groomed and appropriate hygiene  Behavior: appropriate to circumstances Mood: euthymic Affect: mood congruent Speech: normal in rate, volume, and tone Eye Contact: appropriate Psychomotor Activity: appropriate Gait: unable to assess Thought Process: linear, logical, and goal directed  Thought Content/Perception: denies suicidal and homicidal ideation, plan, and intent and no hallucinations, delusions, bizarre thinking or behavior reported or observed Orientation: time, person, place, and purpose of appointment Memory/Concentration: memory, attention, language, and fund of knowledge intact  Insight/Judgment: good  Family & Psychosocial History: Deborah Underwood reported she is married and she has one daughter (age 6.5). 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 Additionally, Deborah Underwood shared her highest level of education  obtained is a bachelor's degree. Currently, Deborah Underwood social support system consists of her friends, co-workers, husband, sister, and parents. Moreover, Deborah Underwood stated she resides with her husband and daughter.   Medical History:  Past Medical History:  Diagnosis Date  . Anxiety   . Back pain   . Depression   . Heavy periods   . Joint pain   . Lower extremity edema   . Menopause   . PCOS (polycystic ovarian syndrome)   . Shoulder pain   . Vitamin B12 deficiency   . Vitamin D deficiency    Past Surgical History:  Procedure Laterality Date  . CESAREAN SECTION    . 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 cholecalciferol (VITAMIN D3) 25 MCG (1000 UNIT) tablet Take 1,000 Units by mouth daily as needed.    . furosemide (LASIX) 20 MG tablet Take 20 mg by mouth daily.    . magnesium oxide (MAG-OX) 400 MG tablet Take 400 mg by mouth daily as needed.    . metFORMIN (GLUCOPHAGE) 500 MG tablet Take 1 tablet (500 mg total) by mouth daily with breakfast. 30 tablet 0  . Pyridoxine HCl (VITAMIN B6) 100 MG TABS Take 1 tablet by mouth daily as needed.    . vitamin B-12 (CYANOCOBALAMIN) 100 MCG tablet Take 100 mcg by mouth daily as needed.    . Vitamin D, Ergocalciferol, (DRISDOL) 1.25 MG (50000 UNIT) CAPS capsule Take 1 capsule (50,000 Units total) by mouth every 7 (seven) days. 4 capsule 0   No current facility-administered medications on file prior to visit.   Mental Health History: Deborah Underwood reported she first attended therapeutic services last year during the pandemic to address ongoing stressors and anxiety. She denied a history of psychotropic medications. Deborah Underwood reported there is no history of hospitalizations for psychiatric  concerns. Deborah Underwood denied a family history of mental health related concerns. Sherrica denied a history of psychological, physical  and sexual abuse, as well as neglect. Regarding trauma, she  disclosed a couple miscarriages, brain tumor, and issues with her in-laws. She denied any safety concerns.   Oza described her typical mood lately as "tired, but okay outside of that" due to work and "perimenopause." Aside from concerns noted above and endorsed on the PHQ-9 and GAD-7, Joua reported decreased motivation and gradual social withdrawal due to various reasons (e.g., work schedule, health concerns, etc.). She also described experiencing low self-esteem. Additionally, Geana stated worry thoughts about her daughter (e.g., Is she enjoying camp?; How is she processing the loss of their dog?). Ayonna endorsed infrequent alcohol use. She endorsed "random" tobacco use, noting she may smoke one cigarette 5 out of 7 nights, adding it is contingent on stress. She denied illicit/recreational substance use. Regarding caffeine intake, Elyssia reported consuming decaf coffee and tea daily. Furthermore, Maliya indicated she is not experiencing the following: hallucinations and delusions, paranoia, symptoms of mania , crying spells and panic attacks. She also denied history of and current suicidal ideation, plan, and intent; history of and current homicidal ideation, plan, and intent; and history of and current engagement in self-harm.  The following strengths were reported by [name]: ability to follow through with goals; sense of humor; and caring. The following strengths were observed by this provider: ability to express thoughts and feelings during the therapeutic session, ability to establish and benefit from a therapeutic relationship, willingness to work toward established goal(s) with the clinic and ability to engage in reciprocal conversation.           Legal History: Kynslie reported there is no history of legal involvement.   Structured Assessments Results: The Patient Health Questionnaire-9 (PHQ-9) is a self-report measure that assesses symptoms and severity of depression over the course of the last two weeks.  Jeania obtained a score of 4 suggesting minimal depression. Latosha finds the endorsed symptoms to be somewhat difficult. [0= Not at all; 1= Several days; 2= More than half the days; 3= Nearly every day] Little interest or pleasure in doing things 0  Feeling down, depressed, or hopeless 0  Trouble falling or staying asleep, or sleeping too much 0  Feeling tired or having little energy  3  Poor appetite or overeating 0  Feeling bad about yourself --- or that you are a failure or have let yourself or your family down 0  Trouble concentrating on things, such as reading the newspaper or watching television 1  Moving or speaking so slowly that other people could have noticed? Or the opposite --- being so fidgety or restless that you have been moving around a lot more than usual 0  Thoughts that you would be better off dead or hurting yourself in some way 0  PHQ-9 Score 4    The Generalized Anxiety Disorder-7 (GAD-7) is a brief self-report measure that assesses symptoms of anxiety over the course of the last two weeks. Deania obtained a score of 5 suggesting mild anxiety. Annel finds the endorsed symptoms to be somewhat difficult. [0= Not at all; 1= Several days; 2= Over half the days; 3= Nearly every day] Feeling nervous, anxious, on edge 0  Not being able to stop or control worrying 1  Worrying too much about different things 1  Trouble relaxing 0  Being so restless that it's hard to sit still 0  Becoming easily annoyed or irritable  3  Feeling afraid as if something awful might happen 0  GAD-7 Score 5   Interventions:  Conducted a chart review Focused on rapport building Verbally administered PHQ-9 and GAD-7 for symptom monitoring Verbally administered Food & Mood questionnaire to assess various behaviors related to emotional eating Provided emphatic reflections and validation Collaborated with patient on a treatment goal  Psychoeducation provided regarding physical versus emotional  hunger  Provisional DSM-5 Diagnosis(es): 311 (F32.8) Other Specified Depressive Disorder, Emotional Eating Behaviors  Plan: Misty StanleyLisa appears able and willing to participate as evidenced by collaboration on a treatment goal, engagement in reciprocal conversation, and asking questions as needed for clarification. The next appointment will be scheduled in 2-3 weeks, which will be via MyChart Video Visit. The following treatment goal was established: increase coping skills. This provider will regularly review the treatment plan and medical chart to keep informed of status changes. Misty StanleyLisa expressed understanding and agreement with the initial treatment plan of care. Misty StanleyLisa will be sent a handout via e-mail to utilize between now and the next appointment to increase awareness of hunger patterns and subsequent eating. Misty StanleyLisa provided verbal consent during today's appointment for this provider to send the handout via e-mail.

## 2019-12-02 NOTE — Telephone Encounter (Signed)
Call patient  Sleep study result  Date of study: 11/21/2019  Impression: Moderate obstructive sleep apnea Mild oxygen desaturations  Recommendation: Recommend CPAP therapy for moderate obstructive sleep apnea Auto titrating CPAP with a pressure settings of 5-20 will be appropriate for treatment Encourage aggressive weight loss measures  Follow-up as previously scheduled, 4 to 6 weeks following initiation of treatment

## 2019-12-03 NOTE — Telephone Encounter (Signed)
Called and left message for patient to return call.  

## 2019-12-03 NOTE — Progress Notes (Signed)
**Note Deborah-Identified via Obfuscation** Chief Complaint:   OBESITY Deborah Underwood (MR# [id number]) is a 49 y.o. female who presents for evaluation and treatment of obesity and related comorbidities. Current BMI is Body mass index is 43.54 kg/m. Deborah Underwood has been struggling with her weight for many years and has been unsuccessful in either losing weight, maintaining weight loss, or reaching her healthy weight goal.  Deborah Underwood is currently in the action stage of change and ready to dedicate time achieving and maintaining a healthier weight. Deborah Underwood is interested in becoming our patient and working on intensive lifestyle modifications including (but not limited to) diet and exercise for weight loss.  Deborah Underwood's habits were reviewed today and are as follows: Her family eats meals together, she thinks her family will eat healthier with her, she struggles with family and or coworkers weight loss sabotage, her desired weight loss is 143 lbs, she has been heavy most of her life, she started gaining weight in high school, her heaviest weight ever was 280 pounds, she has significant food cravings issues, she snacks frequently in the evenings, she skips meals frequently, she frequently makes poor food choices, she frequently eats larger portions than normal and she struggles with emotional eating.  Depression Screen Deborah Underwood's Food and Mood (modified PHQ-9) score was 22.  Depression screen PHQ 2/9 11/28/2019  Decreased Interest 3  Down, Depressed, Hopeless 3  PHQ - 2 Score 6  Altered sleeping 2  Tired, decreased energy 3  Change in appetite 3  Feeling bad or failure about yourself  3  Trouble concentrating 3  Moving slowly or fidgety/restless 2  Suicidal thoughts 0  PHQ-9 Score 22  Difficult doing work/chores Very difficult   Subjective:   1. Other fatigue Deborah Underwood admits to daytime somnolence and admits to waking up still tired. Patent has a history of symptoms of daytime fatigue. Deborah Underwood generally gets 6 or 7 hours of sleep per night, and states  that she has generally restful sleep. Snoring is present. Apneic episodes are not present. Epworth Sleepiness Score is 10.  2. Shortness of breath on exertion [name] notes increasing shortness of breath with exercising and seems to be worsening over time with weight gain. She notes getting out of breath sooner with activity than she used to. This has not gotten worse recently. [name] denies shortness of breath at rest or orthopnea.  3. Vitamin D deficiency Deborah Underwood is on OTC Vit D, and she has no recent labs.  4. Vitamin B 12 deficiency Deborah Underwood is on OTC B12, and she has no recent labs.  5. Other depression with emotional eating Deborah Underwood has a history of depression with anxiety, and she notes significant emotional eating.  6. At risk for heart disease Deborah Underwood is at a higher than average risk for cardiovascular disease due to obesity.   Assessment/Plan:   1. Other fatigue Deborah Underwood does feel that her weight is causing her energy to be lower than it should be. Fatigue may be related to obesity, depression or many other causes. Labs will be ordered, and in the meanwhile, Deborah Underwood will focus on self care including making healthy food choices, increasing physical activity and focusing on stress reduction.  - EKG 12-Lead - CBC with Differential/Platelet - Comprehensive metabolic panel - Hemoglobin A1c - Insulin, random - Lipid Panel With LDL/HDL Ratio - T3 - T4, free - TSH  2. Shortness of breath on exertion Deborah Underwood does feel that she gets out of breath more easily that she used to when she exercises. Deborah Underwood's  shortness of breath appears to be obesity related and exercise induced. She has agreed to work on weight loss and gradually increase exercise to treat her exercise induced shortness of breath. Will continue to monitor closely.  - CBC with Differential/Platelet - Comprehensive metabolic panel  3. Vitamin D deficiency Low Vitamin D level contributes to fatigue and are associated with obesity, breast, and colon  cancer. We will check labs today, and Deborah Underwood will follow-up for routine testing of Vitamin D, at least 2-3 times per year to avoid over-replacement.  - VITAMIN D 25 Hydroxy (Vit-D Deficiency, Fractures)  4. Vitamin B 12 deficiency The diagnosis was reviewed with the patient. We will continue to monitor. We will check labs today, and Deborah Underwood will follow up as directed. Orders and follow up as documented in patient record.  - Vitamin B12 - Folate  5. Other depression with emotional eating Behavior modification techniques were discussed today to help Zi deal with her emotional/non-hunger eating behaviors. We will refer to Dr. Dewaine Conger, our Bariatric Psychologist for evaluation. Orders and follow up as documented in patient record.   6. At risk for heart disease Deborah Underwood was given approximately 30 minutes of coronary artery disease prevention counseling today. She is 49 y.o. female and has risk factors for heart disease including obesity. We discussed intensive lifestyle modifications today with an emphasis on specific weight loss instructions and strategies.   Repetitive spaced learning was employed today to elicit superior memory formation and behavioral change.  7. Class 3 severe obesity with serious comorbidity and body mass index (BMI) of 40.0 to 44.9 in adult, unspecified obesity type (HCC) Deborah Underwood is currently in the action stage of change and her goal is to continue with weight loss efforts. I recommend Deborah Underwood begin the structured treatment plan as follows:  She has agreed to the Category 3 Plan.  Exercise goals: No exercise has been prescribed for now, while we concentrate on nutritional changes.  Behavioral modification strategies: increasing lean protein intake, meal planning and cooking strategies and emotional eating strategies.  She was informed of the importance of frequent follow-up visits to maximize her success with intensive lifestyle modifications for her multiple health conditions. She  was informed we would discuss her lab results at her next visit unless there is a critical issue that needs to be addressed sooner. Deborah Underwood agreed to keep her next visit at the agreed upon time to discuss these results.  Objective:   Blood pressure 106/71, pulse 78, temperature 98.4 F (36.9 C), temperature source Oral, height 5\' 7"  (1.702 m), weight 278 lb (126.1 kg), last menstrual period 11/19/2019, SpO2 99 %. Body mass index is 43.54 kg/m.  EKG: Normal sinus rhythm, rate 75 BPM.  Indirect Calorimeter completed today shows a VO2 of 330 and a REE of 2296.  Her calculated basal metabolic rate is 2297 thus her basal metabolic rate is better than expected.  General: Cooperative, alert, well developed, in no acute distress. HEENT: Conjunctivae and lids unremarkable. Cardiovascular: Regular rhythm.  Lungs: Normal work of breathing. Neurologic: No focal deficits.   Lab Results  Component Value Date   CREATININE 0.95 11/28/2019   BUN 14 11/28/2019   NA 140 11/28/2019   K 4.4 11/28/2019   CL 105 11/28/2019   CO2 24 11/28/2019   Lab Results  Component Value Date   ALT 33 (H) 11/28/2019   AST 29 11/28/2019   ALKPHOS 91 11/28/2019   BILITOT 0.3 11/28/2019   Lab Results  Component Value Date  HGBA1C 5.8 (H) 11/28/2019   HGBA1C 5.6 07/19/2019   Lab Results  Component Value Date   INSULIN 10.0 11/28/2019   Lab Results  Component Value Date   TSH 3.070 11/28/2019   Lab Results  Component Value Date   CHOL 199 11/28/2019   HDL 52 11/28/2019   LDLCALC 124 (H) 11/28/2019   TRIG 131 11/28/2019   Lab Results  Component Value Date   WBC 5.9 11/28/2019   HGB 12.0 11/28/2019   HCT 36.7 11/28/2019   MCV 77 (L) 11/28/2019   PLT 426 11/28/2019   No results found for: IRON, TIBC, FERRITIN  Attestation Statements:   Reviewed by clinician on day of visit: allergies, medications, problem list, medical history, surgical history, family history, social history, and previous  encounter notes.   I, Burt Knack, am acting as transcriptionist for Quillian Quince, MD.  I have reviewed the above documentation for accuracy and completeness, and I agree with the above. - Quillian Quince, MD

## 2019-12-04 NOTE — Telephone Encounter (Signed)
Attempted to call patient, left message.  

## 2019-12-04 NOTE — Telephone Encounter (Signed)
Pt returning a phone call. Pt can be reached at [phone].

## 2019-12-06 NOTE — Telephone Encounter (Signed)
Called and left message for patient to return call.  

## 2019-12-10 NOTE — Telephone Encounter (Signed)
Called and left message for patient to return call.  

## 2019-12-10 NOTE — Telephone Encounter (Signed)
Encounter closed after multiple attempts to call patient.

## 2019-12-11 ENCOUNTER — Telehealth: Payer: Self-pay | Admitting: Pulmonary Disease

## 2019-12-11 NOTE — Telephone Encounter (Signed)
Patient contacted with results of home sleep study. She is going to check cost from insurance and get back to [place]. Patient verbalized understanding of results.

## 2019-12-11 NOTE — Telephone Encounter (Signed)
See alternate encounter for HST results.

## 2019-12-12 ENCOUNTER — Encounter (INDEPENDENT_AMBULATORY_CARE_PROVIDER_SITE_OTHER): Payer: Self-pay | Admitting: Family Medicine

## 2019-12-12 ENCOUNTER — Other Ambulatory Visit: Payer: Self-pay

## 2019-12-12 ENCOUNTER — Ambulatory Visit (INDEPENDENT_AMBULATORY_CARE_PROVIDER_SITE_OTHER): Payer: BC Managed Care – PPO | Admitting: Family Medicine

## 2019-12-12 VITALS — BP 117/79 | HR 80 | Temp 98.3°F | Ht 67.0 in | Wt 278.0 lb

## 2019-12-12 DIAGNOSIS — E559 Vitamin D deficiency, unspecified: Secondary | ICD-10-CM | POA: Diagnosis not present

## 2019-12-12 DIAGNOSIS — E7849 Other hyperlipidemia: Secondary | ICD-10-CM

## 2019-12-12 DIAGNOSIS — Z9189 Other specified personal risk factors, not elsewhere classified: Secondary | ICD-10-CM | POA: Diagnosis not present

## 2019-12-12 DIAGNOSIS — Z6841 Body Mass Index (BMI) 40.0 and over, adult: Secondary | ICD-10-CM

## 2019-12-12 DIAGNOSIS — R7303 Prediabetes: Secondary | ICD-10-CM

## 2019-12-12 MED ORDER — VITAMIN D (ERGOCALCIFEROL) 1.25 MG (50000 UNIT) PO CAPS: 50000.0000 [IU] | ORAL_CAPSULE | ORAL | 0 refills | Status: DC

## 2019-12-12 MED ORDER — METFORMIN HCL 500 MG PO TABS: 500.0000 mg | ORAL_TABLET | Freq: Every day | ORAL | 0 refills | Status: DC

## 2019-12-12 NOTE — Progress Notes (Signed)
Chief Complaint:   OBESITY Deborah Underwood is here to discuss her progress with her obesity treatment plan along with follow-up of her obesity related diagnoses. Deborah Underwood is on the Category 3 Plan and states she is following her eating plan approximately 50% of the time. Deborah Underwood states she is doing 0 minutes 0 times per week.  Today's visit was #: 2 Starting weight: 278 lbs Starting date: 11/28/2019 Today's weight: 278 lbs Today's date: 12/12/2019 Total lbs lost to date: 0 Total lbs lost since last in-office visit: 0  Interim History: Deborah Underwood struggled to follow her plan closely due to some unexpected family issues. When she was able to follow her plan she did well decreasing hunger.  Subjective:   1. Other hyperlipidemia Deborah Underwood's LDL is elevated, and she is working on diet and weight loss. She denies chest pain. I discussed labs with the patient today.  2. Vitamin D deficiency Deborah Underwood is not on Vit D, and her level is not yet at goal. I discussed labs with the patient today.  3. Pre-diabetes Deborah Underwood has a new diagnosis of pre-diabetes. Her A1c is elevated, and she notes polyphagia at times and worse in the PM. I discussed labs with the patient today.  4. At risk for diabetes mellitus Deborah Underwood is at higher than average risk for developing diabetes due to her obesity.   Assessment/Plan:   1. Other hyperlipidemia Cardiovascular risk and specific lipid/LDL goals reviewed. We discussed several lifestyle modifications today. Fardowsa will continue to work on diet, exercise and weight loss efforts. We will recheck labs in 3 months. Orders and follow up as documented in patient record.   2. Vitamin D deficiency Low Vitamin D level contributes to fatigue and are associated with obesity, breast, and colon cancer. Deborah Underwood agreed to start prescription Vitamin D 50,000 IU every week with no refills. Yarden will follow-up for routine testing of Vitamin D, at least 2-3 times per year to avoid over-replacement.  - Vitamin D,  Ergocalciferol, (DRISDOL) 1.25 MG (50000 UNIT) CAPS capsule; Take 1 capsule (50,000 Units total) by mouth every 7 (seven) days.  Dispense: 4 capsule; Refill: 0  3. Pre-diabetes Deborah Underwood will continue to work on weight loss, diet, exercise, and decreasing simple carbohydrates to help decrease the risk of diabetes. Deborah Underwood agreed to start metformin 500 mg q AM with no refills.  - metFORMIN (GLUCOPHAGE) 500 MG tablet; Take 1 tablet (500 mg total) by mouth daily with breakfast.  Dispense: 30 tablet; Refill: 0  4. At risk for diabetes mellitus Deborah Underwood was given approximately 30 minutes of diabetes education and counseling today. We discussed intensive lifestyle modifications today with an emphasis on weight loss as well as increasing exercise and decreasing simple carbohydrates in her diet. We also reviewed medication options with an emphasis on risk versus benefit of those discussed.   Repetitive spaced learning was employed today to elicit superior memory formation and behavioral change.  5. Class 3 severe obesity with serious comorbidity and body mass index (BMI) of 40.0 to 44.9 in adult, unspecified obesity type (HCC) Deborah Underwood is currently in the action stage of change. As such, her goal is to continue with weight loss efforts. She has agreed to the Category 3 Plan. Lean meat equivalents were discussed.  Behavioral modification strategies: increasing lean protein intake, increasing water intake and no skipping meals.  Deborah Underwood has agreed to follow-up with our clinic in 2 weeks. She was informed of the importance of frequent follow-up visits to maximize her success with intensive  lifestyle modifications for her multiple health conditions.   Objective:   Blood pressure 117/79, pulse 80, temperature 98.3 F (36.8 C), temperature source Oral, height 5\' 7"  (1.702 m), weight (!) 278 lb (126.1 kg), last menstrual period 11/19/2019, SpO2 98 %. Body mass index is 43.54 kg/m.  General: Cooperative, alert, well  developed, in no acute distress. HEENT: Conjunctivae and lids unremarkable. Cardiovascular: Regular rhythm.  Lungs: Normal work of breathing. Neurologic: No focal deficits.   Lab Results  Component Value Date   CREATININE 0.95 11/28/2019   BUN 14 11/28/2019   NA 140 11/28/2019   K 4.4 11/28/2019   CL 105 11/28/2019   CO2 24 11/28/2019   Lab Results  Component Value Date   ALT 33 (H) 11/28/2019   AST 29 11/28/2019   ALKPHOS 91 11/28/2019   BILITOT 0.3 11/28/2019   Lab Results  Component Value Date   HGBA1C 5.8 (H) 11/28/2019   HGBA1C 5.6 07/19/2019   Lab Results  Component Value Date   INSULIN 10.0 11/28/2019   Lab Results  Component Value Date   TSH 3.070 11/28/2019   Lab Results  Component Value Date   CHOL 199 11/28/2019   HDL 52 11/28/2019   LDLCALC 124 (H) 11/28/2019   TRIG 131 11/28/2019   Lab Results  Component Value Date   WBC 5.9 11/28/2019   HGB 12.0 11/28/2019   HCT 36.7 11/28/2019   MCV 77 (L) 11/28/2019   PLT 426 11/28/2019   No results found for: IRON, TIBC, FERRITIN  Attestation Statements:   Reviewed by clinician on day of visit: allergies, medications, problem list, medical history, surgical history, family history, social history, and previous encounter notes.   I, 11/30/2019, am acting as transcriptionist for Burt Knack, MD.  I have reviewed the above documentation for accuracy and completeness, and I agree with the above. -  Quillian Quince, MD

## 2019-12-16 ENCOUNTER — Telehealth (INDEPENDENT_AMBULATORY_CARE_PROVIDER_SITE_OTHER): Payer: BC Managed Care – PPO | Admitting: Psychology

## 2019-12-16 ENCOUNTER — Other Ambulatory Visit: Payer: Self-pay

## 2019-12-16 DIAGNOSIS — F3289 Other specified depressive episodes: Secondary | ICD-10-CM

## 2019-12-18 NOTE — Progress Notes (Signed)
  Office: [phone]  /  Fax: [phone]    Date: January 01, 2020   Appointment Start Time: 4:07pm Duration: 27 minutes Provider: Lawerance Cruel, Psy.D. Type of Session: Individual Therapy  Location of Patient: Home Location of Provider: Provider's Home Type of Contact: Telepsychological Visit via MyChart Video Visit  Session Content: This provider called [name] at 4:02pm as she did not present for the telepsychological appointment. A HIPAA compliant voicemail requesting a call back. She called back at 4:04pm. Elicia noted challenges joining; therefore, assistance was provided. As such, today's appointment was initiated 7 minutes late. Patsy is a 49 y.o. female presenting via MyChart Video Visit for a follow-up appointment to address the previously established treatment goal of increasing coping skills. Today's appointment was a telepsychological visit due to COVID-19. Milia provided verbal consent for today's telepsychological appointment and she is aware she is responsible for securing confidentiality on her end of the session. Prior to proceeding with today's appointment, Tarena's physical location at the time of this appointment was obtained as well a phone number she could be reached at in the event of technical difficulties. Francys and this provider participated in today's telepsychological service.   This provider conducted a brief check-in. Leonila reported she is "getting super sick of the repetitive meals." As such, her eating habits were further explored and she was engaged in problem solving (e.g., moving foods around on the plan to make new recipes). Additionally, this provider and Enrica discussed the impact of the dieting mentality on feeling deprived, overeating, overindulging, and possible binge eating. Reviewed emotional and physical hunger. Franceska was receptive to today's appointment as evidenced by openness to sharing and responsiveness to feedback.  Mental Status Examination:  Appearance:  well groomed and appropriate hygiene  Behavior: appropriate to circumstances Mood: euthymic Affect: mood congruent Speech: normal in rate, volume, and tone Eye Contact: appropriate Psychomotor Activity: appropriate Gait: unable to assess Thought Process: linear, logical, and goal directed  Thought Content/Perception: no hallucinations, delusions, bizarre thinking or behavior reported or observed and no evidence of suicidal and homicidal ideation, plan, and intent Orientation: time, person, place, and purpose of appointment Memory/Concentration: memory, attention, language, and fund of knowledge intact  Insight/Judgment: fair  Interventions:  Conducted a brief chart review Provided empathic reflections and validation Reviewed content from the previous session Employed supportive psychotherapy interventions to facilitate reduced distress and to improve coping skills with identified stressors Engaged patient in problem solving  DSM-5 Diagnosis(es): 311 (F32.8) Other Specified Depressive Disorder, Emotional Eating Behaviors  Treatment Goal & Progress: During the initial appointment with this provider, the following treatment goal was established: increase coping skills. Geetika has demonstrated progress in her goal as evidenced by increased awareness of hunger patterns.   Plan: The next appointment will be scheduled in three weeks, which will be via MyChart Video Visit. The next session will focus on working towards the established treatment goal.

## 2019-12-24 ENCOUNTER — Ambulatory Visit (INDEPENDENT_AMBULATORY_CARE_PROVIDER_SITE_OTHER): Payer: BC Managed Care – PPO | Admitting: Family Medicine

## 2019-12-24 ENCOUNTER — Other Ambulatory Visit: Payer: Self-pay

## 2019-12-24 ENCOUNTER — Encounter (INDEPENDENT_AMBULATORY_CARE_PROVIDER_SITE_OTHER): Payer: Self-pay | Admitting: Family Medicine

## 2019-12-24 VITALS — BP 108/70 | HR 66 | Temp 98.3°F | Ht 67.0 in | Wt 273.0 lb

## 2019-12-24 DIAGNOSIS — Z9189 Other specified personal risk factors, not elsewhere classified: Secondary | ICD-10-CM

## 2019-12-24 DIAGNOSIS — Z6841 Body Mass Index (BMI) 40.0 and over, adult: Secondary | ICD-10-CM

## 2019-12-24 DIAGNOSIS — R7303 Prediabetes: Secondary | ICD-10-CM | POA: Diagnosis not present

## 2019-12-24 DIAGNOSIS — E559 Vitamin D deficiency, unspecified: Secondary | ICD-10-CM | POA: Diagnosis not present

## 2019-12-24 DIAGNOSIS — K760 Fatty (change of) liver, not elsewhere classified: Secondary | ICD-10-CM

## 2019-12-24 MED ORDER — METFORMIN HCL 500 MG PO TABS: 500.0000 mg | ORAL_TABLET | Freq: Every day | ORAL | 0 refills | Status: DC

## 2019-12-24 MED ORDER — VITAMIN D (ERGOCALCIFEROL) 1.25 MG (50000 UNIT) PO CAPS: 50000.0000 [IU] | ORAL_CAPSULE | ORAL | 0 refills | Status: DC

## 2019-12-25 NOTE — Progress Notes (Signed)
Chief Complaint:   OBESITY Deborah Underwood is here to discuss her progress with her obesity treatment plan along with follow-up of her obesity related diagnoses. Deborah Underwood is on the Category 3 Plan and states she is following her eating plan approximately 90-95% of the time. Deborah Underwood states she is doing 0 minutes 0 times per week.  Today's visit was #: 3 Starting weight: 278 lbs Starting date: 11/28/2019 Today's weight: 273 lbs Today's date: 12/24/2019 Total lbs lost to date: 5 Total lbs lost since last in-office visit: 5  Interim History: Deborah Underwood has done well with weight loss on her Category 3 plan. Her hunger is controlled. She would like to discuss ways to keep her meals more interesting.  Subjective:   1. Pre-diabetes Deborah Underwood started metformin and she had mild GI symptoms, but this is already improving.  2. Vitamin D deficiency Deborah Underwood started Vit D, and she notes improved energy.  3. NAFLD (nonalcoholic fatty liver disease) Deborah Underwood has questions about this ultrasound NASH and treatment options. She denies jaundice, but she notes lower extremity edema.  4. At risk for side effect of medication Deborah Underwood is at risk for drug side effects due to starting metformin  Assessment/Plan:   1. Pre-diabetes Deborah Underwood will continue to work on weight loss, exercise, and decreasing simple carbohydrates to help decrease the risk of diabetes. We will refill metformin for 1 month.  - metFORMIN (GLUCOPHAGE) 500 MG tablet; Take 1 tablet (500 mg total) by mouth daily with breakfast.  Dispense: 30 tablet; Refill: 0  2. Vitamin D deficiency Low Vitamin D level contributes to fatigue and are associated with obesity, breast, and colon cancer. We will refill prescription Vitamin D for 1 month. Deborah Underwood will follow-up for routine testing of Vitamin D, at least 2-3 times per year to avoid over-replacement.  - Vitamin D, Ergocalciferol, (DRISDOL) 1.25 MG (50000 UNIT) CAPS capsule; Take 1 capsule (50,000 Units total) by mouth every 7  (seven) days.  Dispense: 4 capsule; Refill: 0  3. NAFLD (nonalcoholic fatty liver disease) We discussed the likely diagnosis of non-alcoholic fatty liver disease today and how this condition is obesity related. Deborah Underwood was educated the importance of weight loss. Deborah Underwood was reassured it is highly unlikely this is causing liver failure currently. Deborah Underwood agreed to continue with her weight loss efforts, healthier diet, and exercise as an essential part of her treatment plan. We will recheck labs in 2 months.  4. At risk for side effect of medication Deborah Underwood was given approximately 30 minutes of drug side effect counseling today.  We discussed side effect possibility and risk versus benefits. Deborah Underwood agreed to the medication and will contact this office if these side effects are intolerable.  Repetitive spaced learning was employed today to elicit superior memory formation and behavioral change.  5. Class 3 severe obesity with serious comorbidity and body mass index (BMI) of 40.0 to 44.9 in adult, unspecified obesity type (HCC) Deborah Underwood is currently in the action stage of change. As such, her goal is to continue with weight loss efforts. She has agreed to the Category 3 Plan.   Behavioral modification strategies: increasing lean protein intake and meal planning and cooking strategies.  Deborah Underwood has agreed to follow-up with our clinic in 2 weeks. She was informed of the importance of frequent follow-up visits to maximize her success with intensive lifestyle modifications for her multiple health conditions.   Objective:   Blood pressure 108/70, pulse 66, temperature 98.3 F (36.8 C), temperature source Oral, height 5\' 7"  (  1.702 m), weight 273 lb (123.8 kg), SpO2 98 %. Body mass index is 42.76 kg/m.  General: Cooperative, alert, well developed, in no acute distress. HEENT: Conjunctivae and lids unremarkable. Cardiovascular: Regular rhythm.  Lungs: Normal work of breathing. Neurologic: No focal deficits.   Lab  Results  Component Value Date   CREATININE 0.95 11/28/2019   BUN 14 11/28/2019   NA 140 11/28/2019   K 4.4 11/28/2019   CL 105 11/28/2019   CO2 24 11/28/2019   Lab Results  Component Value Date   ALT 33 (H) 11/28/2019   AST 29 11/28/2019   ALKPHOS 91 11/28/2019   BILITOT 0.3 11/28/2019   Lab Results  Component Value Date   HGBA1C 5.8 (H) 11/28/2019   HGBA1C 5.6 07/19/2019   Lab Results  Component Value Date   INSULIN 10.0 11/28/2019   Lab Results  Component Value Date   TSH 3.070 11/28/2019   Lab Results  Component Value Date   CHOL 199 11/28/2019   HDL 52 11/28/2019   LDLCALC 124 (H) 11/28/2019   TRIG 131 11/28/2019   Lab Results  Component Value Date   WBC 5.9 11/28/2019   HGB 12.0 11/28/2019   HCT 36.7 11/28/2019   MCV 77 (L) 11/28/2019   PLT 426 11/28/2019   No results found for: IRON, TIBC, FERRITIN  Attestation Statements:   Reviewed by clinician on day of visit: allergies, medications, problem list, medical history, surgical history, family history, social history, and previous encounter notes.   I, Burt Knack, am acting as transcriptionist for Quillian Quince, MD.  I have reviewed the above documentation for accuracy and completeness, and I agree with the above. -  Quillian Quince, MD

## 2020-01-01 ENCOUNTER — Other Ambulatory Visit: Payer: Self-pay

## 2020-01-01 ENCOUNTER — Telehealth (INDEPENDENT_AMBULATORY_CARE_PROVIDER_SITE_OTHER): Payer: BC Managed Care – PPO | Admitting: Psychology

## 2020-01-01 DIAGNOSIS — F3289 Other specified depressive episodes: Secondary | ICD-10-CM | POA: Diagnosis not present

## 2020-01-02 ENCOUNTER — Other Ambulatory Visit (INDEPENDENT_AMBULATORY_CARE_PROVIDER_SITE_OTHER): Payer: Self-pay | Admitting: Family Medicine

## 2020-01-02 DIAGNOSIS — E559 Vitamin D deficiency, unspecified: Secondary | ICD-10-CM

## 2020-01-04 ENCOUNTER — Other Ambulatory Visit (INDEPENDENT_AMBULATORY_CARE_PROVIDER_SITE_OTHER): Payer: Self-pay | Admitting: Family Medicine

## 2020-01-04 DIAGNOSIS — R7303 Prediabetes: Secondary | ICD-10-CM

## 2020-01-06 DIAGNOSIS — M25511 Pain in right shoulder: Secondary | ICD-10-CM | POA: Diagnosis not present

## 2020-01-06 DIAGNOSIS — M7501 Adhesive capsulitis of right shoulder: Secondary | ICD-10-CM | POA: Diagnosis not present

## 2020-01-06 DIAGNOSIS — M25512 Pain in left shoulder: Secondary | ICD-10-CM | POA: Diagnosis not present

## 2020-01-07 ENCOUNTER — Ambulatory Visit (INDEPENDENT_AMBULATORY_CARE_PROVIDER_SITE_OTHER): Payer: BC Managed Care – PPO | Admitting: Family Medicine

## 2020-01-07 ENCOUNTER — Encounter (INDEPENDENT_AMBULATORY_CARE_PROVIDER_SITE_OTHER): Payer: Self-pay | Admitting: Family Medicine

## 2020-01-07 ENCOUNTER — Other Ambulatory Visit: Payer: Self-pay

## 2020-01-07 VITALS — BP 128/76 | HR 85 | Temp 98.1°F | Ht 67.0 in | Wt 269.0 lb

## 2020-01-07 DIAGNOSIS — E559 Vitamin D deficiency, unspecified: Secondary | ICD-10-CM | POA: Diagnosis not present

## 2020-01-07 DIAGNOSIS — Z9189 Other specified personal risk factors, not elsewhere classified: Secondary | ICD-10-CM | POA: Diagnosis not present

## 2020-01-07 DIAGNOSIS — Z6841 Body Mass Index (BMI) 40.0 and over, adult: Secondary | ICD-10-CM

## 2020-01-07 DIAGNOSIS — R7303 Prediabetes: Secondary | ICD-10-CM

## 2020-01-07 MED ORDER — VITAMIN D (ERGOCALCIFEROL) 1.25 MG (50000 UNIT) PO CAPS: 50000.0000 [IU] | ORAL_CAPSULE | ORAL | 0 refills | Status: DC

## 2020-01-07 NOTE — Progress Notes (Signed)
Chief Complaint:   OBESITY Deborah Underwood is here to discuss her progress with her obesity treatment plan along with follow-up of her obesity related diagnoses. [name] is on the Category 3 Plan and states she is following her eating plan approximately 70% of the time. [name] states she is doing 0 minutes 0 times per week.  Today's visit was #: 4 Starting weight: 278 lbs Starting date: 11/28/2019 Today's weight: 269 lbs Today's date: 01/07/2020 Total lbs lost to date: 9 Total lbs lost since last in-office visit: 4  Interim History: Deborah Underwood has been experiencing a frozen shoulder, and she is dealing with this. She denies emotional eating. She notes some experience of feeling some significant fatigue. She is following the plan is easy except for quantity of meat. She is interested in getting more creative in the plan. She enjoys cooking but she doesn't know how to manipulate the plan.  Subjective:   1. Vitamin D deficiency Deborah Underwood denies nausea, vomiting, or muscle weakness, but she notes fatigue. Last Vit D level was 28.6.  2. Pre-diabetes Deborah Underwood's last A1c was 5.8 and insulin 10.0. She is not on metformin and denies GI side effects.  3. At risk for diabetes mellitus Deborah Underwood is at higher than average risk for developing diabetes due to her obesity.   Assessment/Plan:   1. Vitamin D deficiency Low Vitamin D level contributes to fatigue and are associated with obesity, breast, and colon cancer. We will refill prescription Vitamin D for 1 month. Deborah Underwood will follow-up for routine testing of Vitamin D, at least 2-3 times per year to avoid over-replacement.  - Vitamin D, Ergocalciferol, (DRISDOL) 1.25 MG (50000 UNIT) CAPS capsule; Take 1 capsule (50,000 Units total) by mouth every 7 (seven) days.  Dispense: 4 capsule; Refill: 0  2. Pre-diabetes Avereigh will continue metformin, no refill needed. Deborah Underwood to work on weight loss, exercise, and decreasing simple carbohydrates to help decrease the risk of diabetes.   3.  At risk for diabetes mellitus Deborah Underwood was given approximately 15 minutes of diabetes education and counseling today. We discussed intensive lifestyle modifications today with an emphasis on weight loss as well as increasing exercise and decreasing simple carbohydrates in her diet. We also reviewed medication options with an emphasis on risk versus benefit of those discussed.   Repetitive spaced learning was employed today to elicit superior memory formation and behavioral change.  4. Class 3 severe obesity with serious comorbidity and body mass index (BMI) of 40.0 to 44.9 in adult, unspecified obesity type (HCC) Deborah Underwood is currently in the action stage of change. As such, her goal is to continue with weight loss efforts. She has agreed to the Category 3 Plan.   Exercise goals: All adults should avoid inactivity. Some physical activity is better than none, and adults who participate in any amount of physical activity gain some health benefits.  Behavioral modification strategies: increasing lean protein intake, meal planning and cooking strategies, keeping healthy foods in the home and planning for success.  Deborah Underwood has agreed to follow-up with our clinic in 2 weeks. She was informed of the importance of frequent follow-up visits to maximize her success with intensive lifestyle modifications for her multiple health conditions.   Objective:   Blood pressure 128/76, pulse 85, temperature 98.1 F (36.7 C), temperature source Oral, height 5\' 7"  (1.702 m), weight 269 lb (122 kg), last menstrual period 12/23/2019, SpO2 96 %. Body mass index is 42.13 kg/m.  General: Cooperative, alert, well developed, in no acute distress.  HEENT: Conjunctivae and lids unremarkable. Cardiovascular: Regular rhythm.  Lungs: Normal work of breathing. Neurologic: No focal deficits.   Lab Results  Component Value Date   CREATININE 0.95 11/28/2019   BUN 14 11/28/2019   NA 140 11/28/2019   K 4.4 11/28/2019   CL 105  11/28/2019   CO2 24 11/28/2019   Lab Results  Component Value Date   ALT 33 (H) 11/28/2019   AST 29 11/28/2019   ALKPHOS 91 11/28/2019   BILITOT 0.3 11/28/2019   Lab Results  Component Value Date   HGBA1C 5.8 (H) 11/28/2019   HGBA1C 5.6 07/19/2019   Lab Results  Component Value Date   INSULIN 10.0 11/28/2019   Lab Results  Component Value Date   TSH 3.070 11/28/2019   Lab Results  Component Value Date   CHOL 199 11/28/2019   HDL 52 11/28/2019   LDLCALC 124 (H) 11/28/2019   TRIG 131 11/28/2019   Lab Results  Component Value Date   WBC 5.9 11/28/2019   HGB 12.0 11/28/2019   HCT 36.7 11/28/2019   MCV 77 (L) 11/28/2019   PLT 426 11/28/2019   No results found for: IRON, TIBC, FERRITIN  Attestation Statements:   Reviewed by clinician on day of visit: allergies, medications, problem list, medical history, surgical history, family history, social history, and previous encounter notes.   I, Burt Knack, am acting as transcriptionist for Reuben Likes, MD.  I have reviewed the above documentation for accuracy and completeness, and I agree with the above. - Katherina Mires, MD

## 2020-01-09 ENCOUNTER — Ambulatory Visit: Payer: BC Managed Care – PPO | Admitting: Pulmonary Disease

## 2020-01-09 NOTE — Progress Notes (Signed)
  Office: [phone]  /  Fax: [phone]    Date: January 23, 2020   Appointment Start Time: 4:04pm Duration: 31 minutes Provider: Lawerance Cruel, Psy.D. Type of Session: Individual Therapy  Location of Patient: Home Location of Provider: Provider's Home Type of Contact: Telepsychological Visit via MyChart Video Visit  Session Content: This provider called [name] at 4:02pm as she did not present for the telepsychological appointment. She indicated she was helping her daughter with homework, but would join. As such, today's appointment was initiated 4 minutes late. Nayeli is a 49 y.o. female presenting for a follow-up appointment to address the previously established treatment goal of increasing coping skills. Today's appointment was a telepsychological visit due to COVID-19. Yoseline provided verbal consent for today's telepsychological appointment and she is aware she is responsible for securing confidentiality on her end of the session. Prior to proceeding with today's appointment, Desiraye's physical location at the time of this appointment was obtained as well a phone number she could be reached at in the event of technical difficulties. Maudine and this provider participated in today's telepsychological service.   This provider conducted a brief check-in. Elzie reported fluctuations in her weight gain and believes she self-sabotaged herself in the past week. This was further explored and processed. This provider and Missey discussed the dieting mentality as well as all or nothing thinking. Also, it was reflected that despite deviations, Pamla engaged in portion control. Coreena was receptive to today's appointment as evidenced by openness to sharing and responsiveness to feedback.  Mental Status Examination:  Appearance: well groomed and appropriate hygiene  Behavior: appropriate to circumstances Mood: euthymic Affect: mood congruent Speech: normal in rate, volume, and tone Eye Contact:  appropriate Psychomotor Activity: appropriate Gait: unable to assess Thought Process: linear, logical, and goal directed  Thought Content/Perception: no hallucinations, delusions, bizarre thinking or behavior reported or observed and no evidence of suicidal and homicidal ideation, plan, and intent Orientation: none Memory/Concentration: memory, attention, language, and fund of knowledge intact  Insight/Judgment: fair  Interventions:  Conducted a brief chart review Provided empathic reflections and validation Employed supportive psychotherapy interventions to facilitate reduced distress and to improve coping skills with identified stressors Psychoeducation provided regarding all-or-nothing thinking  DSM-5 Diagnosis(es): 311 (F32.8) Other Specified Depressive Disorder, Emotional Eating Behaviors  Treatment Goal & Progress: During the initial appointment with this provider, the following treatment goal was established: increase coping skills. Nell has demonstrated progress in her goal as evidenced by increased awareness of hunger patterns.  Plan: The next appointment will be scheduled in two weeks, which will be via MyChart Video Visit. The next session will focus on working towards the established treatment goal.

## 2020-01-21 ENCOUNTER — Ambulatory Visit (INDEPENDENT_AMBULATORY_CARE_PROVIDER_SITE_OTHER): Payer: BC Managed Care – PPO | Admitting: Bariatrics

## 2020-01-23 ENCOUNTER — Encounter (INDEPENDENT_AMBULATORY_CARE_PROVIDER_SITE_OTHER): Payer: Self-pay | Admitting: Family Medicine

## 2020-01-23 ENCOUNTER — Ambulatory Visit (INDEPENDENT_AMBULATORY_CARE_PROVIDER_SITE_OTHER): Payer: BC Managed Care – PPO | Admitting: Family Medicine

## 2020-01-23 ENCOUNTER — Other Ambulatory Visit: Payer: Self-pay

## 2020-01-23 ENCOUNTER — Telehealth (INDEPENDENT_AMBULATORY_CARE_PROVIDER_SITE_OTHER): Payer: BC Managed Care – PPO | Admitting: Psychology

## 2020-01-23 VITALS — BP 119/77 | HR 86 | Temp 98.3°F | Ht 67.0 in | Wt 270.0 lb

## 2020-01-23 DIAGNOSIS — E559 Vitamin D deficiency, unspecified: Secondary | ICD-10-CM | POA: Diagnosis not present

## 2020-01-23 DIAGNOSIS — F3289 Other specified depressive episodes: Secondary | ICD-10-CM

## 2020-01-23 DIAGNOSIS — Z6841 Body Mass Index (BMI) 40.0 and over, adult: Secondary | ICD-10-CM | POA: Diagnosis not present

## 2020-01-23 DIAGNOSIS — R7303 Prediabetes: Secondary | ICD-10-CM | POA: Diagnosis not present

## 2020-01-23 DIAGNOSIS — Z9189 Other specified personal risk factors, not elsewhere classified: Secondary | ICD-10-CM | POA: Diagnosis not present

## 2020-01-23 NOTE — Progress Notes (Signed)
Chief Complaint:   OBESITY Deborah Underwood is here to discuss her progress with her obesity treatment plan along with follow-up of her obesity related diagnoses. Deborah Underwood is on the Category 3 Plan and states she is following her eating plan approximately 75-80% of the time. Deborah Underwood states she is practicing Volley ball for 60 minutes 1 time per week.  Today's visit was #: 5 Starting weight: 278 lbs Starting date: 11/28/2019 Today's weight: 270 lbs Today's date: 01/23/2020 Total lbs lost to date: 8 Total lbs lost since last in-office visit: 0  Interim History: Deborah Underwood did have a few splurges in terms of brunch and then pizza. She did notice a significant weight gain after these splurges. She is going to the beach for 5 days next week. She does anticipate this being a challenge. She does find that preparing 2 meals tends to make following the plan harder.  Subjective:   1. Pre-diabetes Deborah Underwood has propensity to eat carbohydrates especially as her family gravitates towards carbohydrates. Last A1c was 5.8 (this was a new diagnosis).  2. Vitamin D deficiency Deborah Underwood denies nausea, vomiting, or muscle weakness, but she notes fatigue. She is on prescription Vit D.  3. At risk for diabetes mellitus Deborah Underwood is at higher than average risk for developing diabetes due to her obesity.   Assessment/Plan:   1. Pre-diabetes Deborah Underwood will continue to work on weight loss, exercise, and decreasing simple carbohydrates to help decrease the risk of diabetes. We will repeat labs in late October.  2. Vitamin D deficiency Low Vitamin D level contributes to fatigue and are associated with obesity, breast, and colon cancer. We will refill prescription Vitamin D for 1 month. Deborah Underwood will follow-up for routine testing of Vitamin D, at least 2-3 times per year to avoid over-replacement.  - Vitamin D, Ergocalciferol, (DRISDOL) 1.25 MG (50000 UNIT) CAPS capsule; Take 1 capsule (50,000 Units total) by mouth every 7 (seven) days.  Dispense: 4  capsule; Refill: 0  3. At risk for diabetes mellitus Deborah Underwood was given approximately 15 minutes of diabetes education and counseling today. We discussed intensive lifestyle modifications today with an emphasis on weight loss as well as increasing exercise and decreasing simple carbohydrates in her diet. We also reviewed medication options with an emphasis on risk versus benefit of those discussed.   Repetitive spaced learning was employed today to elicit superior memory formation and behavioral change.  4. Class 3 severe obesity with serious comorbidity and body mass index (BMI) of 40.0 to 44.9 in adult, unspecified obesity type (HCC) Deborah Underwood is currently in the action stage of change. As such, her goal is to continue with weight loss efforts. She has agreed to the Category 3 Plan and keeping a food journal and adhering to recommended goals of 450-600 calories and 45+ grams of protein at supper daily.   Exercise goals: As is.  Behavioral modification strategies: increasing lean protein intake, increasing vegetables, meal planning and cooking strategies, keeping healthy foods in the home and planning for success.  Deborah Underwood has agreed to follow-up with our clinic in 2 weeks. She was informed of the importance of frequent follow-up visits to maximize her success with intensive lifestyle modifications for her multiple health conditions.   Objective:   Blood pressure 119/77, pulse 86, temperature 98.3 F (36.8 C), temperature source Oral, height 5\' 7"  (1.702 m), weight 270 lb (122.5 kg), last menstrual period 12/23/2019, SpO2 98 %. Body mass index is 42.29 kg/m.  General: Cooperative, alert, well developed, in no  acute distress. HEENT: Conjunctivae and lids unremarkable. Cardiovascular: Regular rhythm.  Lungs: Normal work of breathing. Neurologic: No focal deficits.   Lab Results  Component Value Date   CREATININE 0.95 11/28/2019   BUN 14 11/28/2019   NA 140 11/28/2019   K 4.4 11/28/2019   CL 105  11/28/2019   CO2 24 11/28/2019   Lab Results  Component Value Date   ALT 33 (H) 11/28/2019   AST 29 11/28/2019   ALKPHOS 91 11/28/2019   BILITOT 0.3 11/28/2019   Lab Results  Component Value Date   HGBA1C 5.8 (H) 11/28/2019   HGBA1C 5.6 07/19/2019   Lab Results  Component Value Date   INSULIN 10.0 11/28/2019   Lab Results  Component Value Date   TSH 3.070 11/28/2019   Lab Results  Component Value Date   CHOL 199 11/28/2019   HDL 52 11/28/2019   LDLCALC 124 (H) 11/28/2019   TRIG 131 11/28/2019   Lab Results  Component Value Date   WBC 5.9 11/28/2019   HGB 12.0 11/28/2019   HCT 36.7 11/28/2019   MCV 77 (L) 11/28/2019   PLT 426 11/28/2019   No results found for: IRON, TIBC, FERRITIN  Attestation Statements:   Reviewed by clinician on day of visit: allergies, medications, problem list, medical history, surgical history, family history, social history, and previous encounter notes.   I, Burt Knack, am acting as transcriptionist for Reuben Likes, MD.  I have reviewed the above documentation for accuracy and completeness, and I agree with the above. - Katherina Mires, MD

## 2020-01-23 NOTE — Progress Notes (Unsigned)
Office: [phone]  /  Fax: [phone]    Date: February 06, 2020   Appointment Start Time: *** Duration: *** minutes Provider: Lawerance Cruel, Psy.D. Type of Session: Individual Therapy  Location of Patient: {gbptloc:23249} Location of Provider: Provider's Home Type of Contact: Telepsychological Visit via MyChart Video Visit  Session Content: This provider called Deborah Underwood at 8:32am as she did not present for the telepsychological appointment. A HIPAA compliant voicemail was left requesting a call back. As such, today's appointment was initiated *** minutes late.  Deborah Underwood is a 49 y.o. female presenting for a follow-up appointment to address the previously established treatment goal of increasing coping skills. Today's appointment was a telepsychological visit due to COVID-19. Deborah Underwood provided verbal consent for today's telepsychological appointment and she is aware she is responsible for securing confidentiality on her end of the session. Prior to proceeding with today's appointment, Deborah Underwood's physical location at the time of this appointment was obtained as well a phone number she could be reached at in the event of technical difficulties. Deborah Underwood and this provider participated in today's telepsychological service.   This provider conducted a brief check-in and verbally administered the PHQ-9 and GAD-7. ***Psychoeducation regarding triggers for emotional eating was provided. Deborah Underwood was provided a handout, and encouraged to utilize the handout between now and the next appointment to increase awareness of triggers and frequency. Deborah Underwood agreed. This provider also discussed behavioral strategies for specific triggers, such as placing the utensil down when conversing to avoid mindless eating. Deborah Underwood provided verbal consent during today's appointment for this provider to send *** via e-mail. Deborah Underwood was receptive to today's appointment as evidenced by openness to sharing, responsiveness to feedback, and  {gbreceptiveness:23401}.  Mental Status Examination:  Appearance: {Appearance:22431} Behavior: {Behavior:22445} Mood: {gbmood:21757} Affect: {Affect:22436} Speech: {Speech:22432} Eye Contact: {Eye Contact:22433} Psychomotor Activity: {Motor Activity:22434} Gait: {gbgait:23404} Thought Process: {thought process:22448}  Thought Content/Perception: {disturbances:22451} Orientation: {Orientation:22437} Memory/Concentration: {gbcognition:22449} Insight/Judgment: {Insight:22446}  Structured Assessments Results: The Patient Health Questionnaire-9 (PHQ-9) is a self-report measure that assesses symptoms and severity of depression over the course of the last two weeks. Deborah Underwood obtained a score of *** suggesting {GBPHQ9SEVERITY:21752}. Deborah Underwood finds the endorsed symptoms to be {gbphq9difficulty:21754}. [0= Not at all; 1= Several days; 2= More than half the days; 3= Nearly every day] Little interest or pleasure in doing things ***  Feeling down, depressed, or hopeless ***  Trouble falling or staying asleep, or sleeping too much ***  Feeling tired or having little energy ***  Poor appetite or overeating ***  Feeling bad about yourself --- or that you are a failure or have let yourself or your family down ***  Trouble concentrating on things, such as reading the newspaper or watching television ***  Moving or speaking so slowly that other people could have noticed? Or the opposite --- being so fidgety or restless that you have been moving around a lot more than usual ***  Thoughts that you would be better off dead or hurting yourself in some way ***  PHQ-9 Score ***    The Generalized Anxiety Disorder-7 (GAD-7) is a brief self-report measure that assesses symptoms of anxiety over the course of the last two weeks. Deborah Underwood obtained a score of *** suggesting {gbgad7severity:21753}. Deborah Underwood finds the endorsed symptoms to be {gbphq9difficulty:21754}. 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 Deborah Underwood has demonstrated progress in her goal as evidenced by {gbtxprogress:22839}. Deborah Underwood also {gbtxprogress2:22951}.  Plan: The next appointment will be scheduled in {gbweeks:21758}, which will be {gbtxmodality:23402}. The next session will focus on {Plan for Next Appointment:23400}.

## 2020-01-28 MED ORDER — VITAMIN D (ERGOCALCIFEROL) 1.25 MG (50000 UNIT) PO CAPS: 50000.0000 [IU] | ORAL_CAPSULE | ORAL | 0 refills | Status: DC

## 2020-01-29 ENCOUNTER — Other Ambulatory Visit (INDEPENDENT_AMBULATORY_CARE_PROVIDER_SITE_OTHER): Payer: Self-pay | Admitting: Family Medicine

## 2020-01-29 ENCOUNTER — Encounter (INDEPENDENT_AMBULATORY_CARE_PROVIDER_SITE_OTHER): Payer: Self-pay

## 2020-01-29 DIAGNOSIS — R7303 Prediabetes: Secondary | ICD-10-CM

## 2020-01-29 NOTE — Telephone Encounter (Signed)
Message sent to pt-CS 

## 2020-01-30 ENCOUNTER — Other Ambulatory Visit (INDEPENDENT_AMBULATORY_CARE_PROVIDER_SITE_OTHER): Payer: Self-pay | Admitting: Family Medicine

## 2020-01-30 DIAGNOSIS — E559 Vitamin D deficiency, unspecified: Secondary | ICD-10-CM

## 2020-02-01 ENCOUNTER — Other Ambulatory Visit (INDEPENDENT_AMBULATORY_CARE_PROVIDER_SITE_OTHER): Payer: Self-pay | Admitting: Family Medicine

## 2020-02-01 DIAGNOSIS — R7303 Prediabetes: Secondary | ICD-10-CM

## 2020-02-04 DIAGNOSIS — M25511 Pain in right shoulder: Secondary | ICD-10-CM | POA: Diagnosis not present

## 2020-02-05 ENCOUNTER — Ambulatory Visit (INDEPENDENT_AMBULATORY_CARE_PROVIDER_SITE_OTHER): Payer: BC Managed Care – PPO | Admitting: Family Medicine

## 2020-02-05 ENCOUNTER — Encounter (INDEPENDENT_AMBULATORY_CARE_PROVIDER_SITE_OTHER): Payer: Self-pay | Admitting: Family Medicine

## 2020-02-05 ENCOUNTER — Other Ambulatory Visit: Payer: Self-pay

## 2020-02-05 VITALS — BP 111/81 | HR 71 | Temp 98.1°F | Ht 67.0 in | Wt 265.0 lb

## 2020-02-05 DIAGNOSIS — Z9189 Other specified personal risk factors, not elsewhere classified: Secondary | ICD-10-CM | POA: Diagnosis not present

## 2020-02-05 DIAGNOSIS — R7303 Prediabetes: Secondary | ICD-10-CM | POA: Diagnosis not present

## 2020-02-05 DIAGNOSIS — E559 Vitamin D deficiency, unspecified: Secondary | ICD-10-CM

## 2020-02-05 DIAGNOSIS — Z6841 Body Mass Index (BMI) 40.0 and over, adult: Secondary | ICD-10-CM | POA: Diagnosis not present

## 2020-02-05 MED ORDER — VITAMIN D (ERGOCALCIFEROL) 1.25 MG (50000 UNIT) PO CAPS: 50000.0000 [IU] | ORAL_CAPSULE | ORAL | 0 refills | Status: DC

## 2020-02-05 MED ORDER — METFORMIN HCL 500 MG PO TABS: 500.0000 mg | ORAL_TABLET | Freq: Every day | ORAL | 0 refills | Status: DC

## 2020-02-06 ENCOUNTER — Telehealth (INDEPENDENT_AMBULATORY_CARE_PROVIDER_SITE_OTHER): Payer: Self-pay | Admitting: Psychology

## 2020-02-06 ENCOUNTER — Telehealth (INDEPENDENT_AMBULATORY_CARE_PROVIDER_SITE_OTHER): Payer: BC Managed Care – PPO | Admitting: Psychology

## 2020-02-06 NOTE — Telephone Encounter (Signed)
  Office: [phone]  /  Fax: [phone]  Date of Call: February 06, 2020  Time of Call: 8:32am Provider: Lawerance Cruel, PsyD  CONTENT: This provider called Deborah Underwood to check-in as she did not present for today's MyChart Video Visit appointment at 8:30am. A HIPAA compliant voicemail was left requesting a call back. Of note, this provider stayed on the MyChart Video Visit appointment for 5 minutes prior to signing off per the clinic's grace period policy.    PLAN: This provider will wait for [name] to call back. No further follow-up planned by this provider.

## 2020-02-09 NOTE — Progress Notes (Signed)
Chief Complaint:   OBESITY Deborah Underwood is here to discuss her progress with her obesity treatment plan along with follow-up of her obesity related diagnoses. Deborah Underwood is on the Category 3 Plan and keeping a food journal and adhering to recommended goals of 450-600 calories and 45+ grams of protein at supper daily and states she is following her eating plan approximately 70% of the time. Deborah Underwood states she was swimming and walking at [employer].  Today's visit was #: 6 Starting weight: 278 lbs Starting date: 11/28/2019 Today's weight: 265 lbs Today's date: 02/05/2020 Total lbs lost to date: 13 Total lbs lost since last in-office visit: 5  Interim History: Deborah Underwood went to [employer] last week. Prior to the beach trip she reports that she has been closely adhering to the Category 3 plan. She did have some sugar free cookies. Her cravings has improved but convenience of pre-packaged food is still present. She is still often opting for pre-packaged sugar free cookies when she is strapped for time.  Subjective:   1. Vitamin D deficiency Deborah Underwood denies nausea, vomiting, or muscle weakness, but she notes fatigue. Last Vit D level was 28.6.  2. Pre-diabetes Deborah Underwood's last A1c was 5.8 and insulin 10.0. She notes carbohydrate cravings occasionally. She denies GI side effects of metformin.  3. At risk for diabetes mellitus Deborah Underwood is at higher than average risk for developing diabetes due to her obesity.   Assessment/Plan:   1. Vitamin D deficiency Low Vitamin D level contributes to fatigue and are associated with obesity, breast, and colon cancer. We will refill prescription Vitamin D for 1 month. Abriella will follow-up for routine testing of Vitamin D, at least 2-3 times per year to avoid over-replacement.  - Vitamin D, Ergocalciferol, (DRISDOL) 1.25 MG (50000 UNIT) CAPS capsule; Take 1 capsule (50,000 Units total) by mouth every 7 (seven) days.  Dispense: 4 capsule; Refill: 0  2. Pre-diabetes Deborah Underwood will continue  to work on weight loss, exercise, and decreasing simple carbohydrates to help decrease the risk of diabetes. We will refill metformin for 1 month.  - metFORMIN (GLUCOPHAGE) 500 MG tablet; Take 1 tablet (500 mg total) by mouth daily with breakfast.  Dispense: 30 tablet; Refill: 0  3. At risk for diabetes mellitus Deborah Underwood was given approximately 15 minutes of diabetes education and counseling today. We discussed intensive lifestyle modifications today with an emphasis on weight loss as well as increasing exercise and decreasing simple carbohydrates in her diet. We also reviewed medication options with an emphasis on risk versus benefit of those discussed.   Repetitive spaced learning was employed today to elicit superior memory formation and behavioral change.  4. Class 3 severe obesity with serious comorbidity and body mass index (BMI) of 40.0 to 44.9 in adult, unspecified obesity type (HCC) Deborah Underwood is currently in the action stage of change. As such, her goal is to continue with weight loss efforts. She has agreed to the Category 3 Plan.   Exercise goals: Deborah Underwood is to make plan for implementation of physical activity for 10-15 minutes 3-4 times per week.  Behavioral modification strategies: increasing lean protein intake, meal planning and cooking strategies, keeping healthy foods in the home and planning for success.  Deborah Underwood has agreed to follow-up with our clinic in 2 weeks. She was informed of the importance of frequent follow-up visits to maximize her success with intensive lifestyle modifications for her multiple health conditions.   Objective:   Blood pressure 111/81, pulse 71, temperature 98.1 F (  36.7 C), temperature source Oral, height 5\' 7"  (1.702 m), weight 265 lb (120.2 kg), SpO2 98 %. Body mass index is 41.5 kg/m.  General: Cooperative, alert, well developed, in no acute distress. HEENT: Conjunctivae and lids unremarkable. Cardiovascular: Regular rhythm.  Lungs: Normal work of  breathing. Neurologic: No focal deficits.   Lab Results  Component Value Date   CREATININE 0.95 11/28/2019   BUN 14 11/28/2019   NA 140 11/28/2019   K 4.4 11/28/2019   CL 105 11/28/2019   CO2 24 11/28/2019   Lab Results  Component Value Date   ALT 33 (H) 11/28/2019   AST 29 11/28/2019   ALKPHOS 91 11/28/2019   BILITOT 0.3 11/28/2019   Lab Results  Component Value Date   HGBA1C 5.8 (H) 11/28/2019   HGBA1C 5.6 07/19/2019   Lab Results  Component Value Date   INSULIN 10.0 11/28/2019   Lab Results  Component Value Date   TSH 3.070 11/28/2019   Lab Results  Component Value Date   CHOL 199 11/28/2019   HDL 52 11/28/2019   LDLCALC 124 (H) 11/28/2019   TRIG 131 11/28/2019   Lab Results  Component Value Date   WBC 5.9 11/28/2019   HGB 12.0 11/28/2019   HCT 36.7 11/28/2019   MCV 77 (L) 11/28/2019   PLT 426 11/28/2019   No results found for: IRON, TIBC, FERRITIN  Attestation Statements:   Reviewed by clinician on day of visit: allergies, medications, problem list, medical history, surgical history, family history, social history, and previous encounter notes.   I, 11/30/2019, am acting as transcriptionist for Burt Knack, MD.  I have reviewed the above documentation for accuracy and completeness, and I agree with the above. - Reuben Likes, MD

## 2020-02-14 DIAGNOSIS — M25511 Pain in right shoulder: Secondary | ICD-10-CM | POA: Diagnosis not present

## 2020-02-21 DIAGNOSIS — M25511 Pain in right shoulder: Secondary | ICD-10-CM | POA: Diagnosis not present

## 2020-02-24 ENCOUNTER — Ambulatory Visit (INDEPENDENT_AMBULATORY_CARE_PROVIDER_SITE_OTHER): Payer: BC Managed Care – PPO | Admitting: Family Medicine

## 2020-02-24 ENCOUNTER — Encounter (INDEPENDENT_AMBULATORY_CARE_PROVIDER_SITE_OTHER): Payer: Self-pay | Admitting: Family Medicine

## 2020-02-24 ENCOUNTER — Other Ambulatory Visit: Payer: Self-pay

## 2020-02-24 VITALS — BP 126/71 | HR 69 | Temp 98.0°F | Ht 67.0 in | Wt 263.0 lb

## 2020-02-24 DIAGNOSIS — E559 Vitamin D deficiency, unspecified: Secondary | ICD-10-CM

## 2020-02-24 DIAGNOSIS — Z9189 Other specified personal risk factors, not elsewhere classified: Secondary | ICD-10-CM

## 2020-02-24 DIAGNOSIS — R7303 Prediabetes: Secondary | ICD-10-CM | POA: Diagnosis not present

## 2020-02-24 DIAGNOSIS — Z6841 Body Mass Index (BMI) 40.0 and over, adult: Secondary | ICD-10-CM

## 2020-02-24 MED ORDER — VITAMIN D (ERGOCALCIFEROL) 1.25 MG (50000 UNIT) PO CAPS: 50000.0000 [IU] | ORAL_CAPSULE | ORAL | 0 refills | Status: DC

## 2020-02-24 MED ORDER — METFORMIN HCL 500 MG PO TABS: 500.0000 mg | ORAL_TABLET | Freq: Every day | ORAL | 0 refills | Status: DC

## 2020-02-24 NOTE — Progress Notes (Signed)
Chief Complaint:   OBESITY Deborah Underwood is here to discuss her progress with her obesity treatment plan along with follow-up of her obesity related diagnoses. Deborah Underwood is on the Category 3 Plan and states she is following her eating plan approximately 25% of the time. Deborah Underwood states she is playing volley ball for 45 minutes 2-3 times per week.  Today's visit was #: 7 Starting weight: 278 lbs Starting date: 11/28/2019 Today's weight: 263 lbs Today's date: 02/24/2020 Total lbs lost to date: 15 Total lbs lost since last in-office visit: 2  Interim History: Deborah Underwood is now journaling the complete day versus following Category 3. I reviewed her MyFitness Pal login, and she is ranging from 50-80 grams of protein daily. She admits to struggling with cravings for sweets.   Subjective:   1. Pre-diabetes  Last A1c was 5.8. She is very concerned about progression of pre-diabetes to diabetes. She is on metformin currently.   Lab Results  Component Value Date   HGBA1C 5.8 (H) 11/28/2019   Lab Results  Component Value Date   INSULIN 10.0 11/28/2019   2. Vitamin D deficiency Deborah Underwood's last Vit D level was low at 28.6 on 11/28/2019. She is on weekly prescription Vit D.  3. At risk for diabetes mellitus Deborah Underwood is at higher than average risk for developing diabetes due to her obesity and pre-diabetic status.   Assessment/Plan:   1. Pre-diabetes Deborah Underwood will continue to work on weight loss, exercise, increase protein snacks, and avoid simple carbohydrates to help decrease the risk of diabetes. We will refill metformin for 1 month.  - metFORMIN (GLUCOPHAGE) 500 MG tablet; Take 1 tablet (500 mg total) by mouth daily with breakfast.  Dispense: 30 tablet; Refill: 0  2. Vitamin D deficiency  We will recheck labs in November. We will refill prescription Vitamin D for 1 month.  - Vitamin D, Ergocalciferol, (DRISDOL) 1.25 MG (50000 UNIT) CAPS capsule; Take 1 capsule (50,000 Units total) by mouth every 7 (seven) days.   Dispense: 4 capsule; Refill: 0  3. At risk for diabetes mellitus Deborah Underwood was given approximately 15 minutes of diabetes education and counseling today. We discussed intensive lifestyle modifications today with an emphasis on weight loss as well as increasing exercise and decreasing simple carbohydrates in her diet. We also reviewed medication options with an emphasis on risk versus benefit of those discussed.  We discussed insulin resistance in depth and it's possible progression to type 2 diabetes.    Repetitive spaced learning was employed today to elicit superior memory formation and behavioral change.  4. Class 3 severe obesity with serious comorbidity and body mass index (BMI) of 40.0 to 44.9 in adult, unspecified obesity type (HCC) Deborah Underwood is currently in the action stage of change. As such, her goal is to continue with weight loss efforts. She has agreed to keeping a food journal and adhering to recommended goals of 1500-1600 calories and 100 grams of protein daily.   Handout given today: Protein Content of Food. I discussed adding protein rich snacks.  Exercise goals: As is.  Behavioral modification strategies: increasing lean protein intake, decreasing simple carbohydrates and better snacking choices.  Deborah Underwood has agreed to follow-up with our clinic in 2 weeks.  Objective:   Blood pressure 126/71, pulse 69, temperature 98 F (36.7 C), temperature source Oral, height 5\' 7"  (1.702 m), weight 263 lb (119.3 kg), SpO2 99 %. Body mass index is 41.19 kg/m.  General: Cooperative, alert, well developed, in no acute distress. HEENT: Conjunctivae  and lids unremarkable. Cardiovascular: Regular rhythm.  Lungs: Normal work of breathing. Neurologic: No focal deficits.   Lab Results  Component Value Date   CREATININE 0.95 11/28/2019   BUN 14 11/28/2019   NA 140 11/28/2019   K 4.4 11/28/2019   CL 105 11/28/2019   CO2 24 11/28/2019   Lab Results  Component Value Date   ALT 33 (H) 11/28/2019    AST 29 11/28/2019   ALKPHOS 91 11/28/2019   BILITOT 0.3 11/28/2019   Lab Results  Component Value Date   HGBA1C 5.8 (H) 11/28/2019   HGBA1C 5.6 07/19/2019   Lab Results  Component Value Date   INSULIN 10.0 11/28/2019   Lab Results  Component Value Date   TSH 3.070 11/28/2019   Lab Results  Component Value Date   CHOL 199 11/28/2019   HDL 52 11/28/2019   LDLCALC 124 (H) 11/28/2019   TRIG 131 11/28/2019   Lab Results  Component Value Date   WBC 5.9 11/28/2019   HGB 12.0 11/28/2019   HCT 36.7 11/28/2019   MCV 77 (L) 11/28/2019   PLT 426 11/28/2019   No results found for: IRON, TIBC, FERRITIN  Attestation Statements:   Reviewed by clinician on day of visit: allergies, medications, problem list, medical history, surgical history, family history, social history, and previous encounter notes.   Trude Mcburney, am acting as Energy manager for [place], FNP-C.  I have reviewed the above documentation for accuracy and completeness, and I agree with the above. -  Jesse Sans, FNP

## 2020-02-25 ENCOUNTER — Encounter (INDEPENDENT_AMBULATORY_CARE_PROVIDER_SITE_OTHER): Payer: Self-pay | Admitting: Family Medicine

## 2020-02-25 DIAGNOSIS — E559 Vitamin D deficiency, unspecified: Secondary | ICD-10-CM | POA: Insufficient documentation

## 2020-02-25 DIAGNOSIS — R7303 Prediabetes: Secondary | ICD-10-CM | POA: Insufficient documentation

## 2020-02-27 ENCOUNTER — Encounter (INDEPENDENT_AMBULATORY_CARE_PROVIDER_SITE_OTHER): Payer: Self-pay | Admitting: Family Medicine

## 2020-02-27 NOTE — Telephone Encounter (Signed)
Please review

## 2020-02-28 DIAGNOSIS — M25511 Pain in right shoulder: Secondary | ICD-10-CM | POA: Diagnosis not present

## 2020-03-03 ENCOUNTER — Other Ambulatory Visit (INDEPENDENT_AMBULATORY_CARE_PROVIDER_SITE_OTHER): Payer: Self-pay | Admitting: Family Medicine

## 2020-03-03 DIAGNOSIS — R7303 Prediabetes: Secondary | ICD-10-CM

## 2020-03-04 DIAGNOSIS — M25511 Pain in right shoulder: Secondary | ICD-10-CM | POA: Diagnosis not present

## 2020-03-09 ENCOUNTER — Other Ambulatory Visit: Payer: Self-pay

## 2020-03-09 ENCOUNTER — Ambulatory Visit (INDEPENDENT_AMBULATORY_CARE_PROVIDER_SITE_OTHER): Payer: BC Managed Care – PPO | Admitting: Family Medicine

## 2020-03-09 ENCOUNTER — Encounter (INDEPENDENT_AMBULATORY_CARE_PROVIDER_SITE_OTHER): Payer: Self-pay | Admitting: Family Medicine

## 2020-03-09 VITALS — BP 104/71 | HR 85 | Temp 98.4°F | Ht 67.0 in | Wt 260.0 lb

## 2020-03-09 DIAGNOSIS — E559 Vitamin D deficiency, unspecified: Secondary | ICD-10-CM

## 2020-03-09 DIAGNOSIS — Z9189 Other specified personal risk factors, not elsewhere classified: Secondary | ICD-10-CM

## 2020-03-09 DIAGNOSIS — N921 Excessive and frequent menstruation with irregular cycle: Secondary | ICD-10-CM | POA: Diagnosis not present

## 2020-03-09 DIAGNOSIS — R7303 Prediabetes: Secondary | ICD-10-CM

## 2020-03-09 DIAGNOSIS — Z6841 Body Mass Index (BMI) 40.0 and over, adult: Secondary | ICD-10-CM

## 2020-03-09 MED ORDER — VITAMIN D (ERGOCALCIFEROL) 1.25 MG (50000 UNIT) PO CAPS: 50000.0000 [IU] | ORAL_CAPSULE | ORAL | 0 refills | Status: DC

## 2020-03-10 NOTE — Progress Notes (Signed)
Chief Complaint:   OBESITY Deborah Underwood is here to discuss her progress with her obesity treatment plan along with follow-up of her obesity related diagnoses. Deborah Underwood is keeping a food journal and adhering to recommended goals of 1500-1600 calories and 100 grams of protein and states she is following her eating plan approximately 90% of the time. Deborah Underwood states she is walking and volleyball 20 minutes 7 times per week.  Today's visit was #: 8 Starting weight: 278 lbs Starting date: 11/28/2019 Today's weight: 260 lbs Today's date: 03/09/2020 Total lbs lost to date: 18 Total lbs lost since last in-office visit: 3  Interim History: Deborah Underwood has adhered very well to the plan and is down 3 lbs today. She notes some hunger, which has slowly decreased. She stopped metformin because it likely caused her vitiligo. She is now consistently meeting her protein goal.  Subjective:   Prediabetes. . Last A1c was elevated at 5.8. Deborah Underwood had been on metformin and stopped it because she found it could be causing her vitiligo. She has had some hunger since stopping metformin.  Lab Results  Component Value Date   HGBA1C 5.8 (H) 11/28/2019   Lab Results  Component Value Date   INSULIN 10.0 11/28/2019   Vitamin D deficiency. Vitamin D level low at 25.7.   Ref. Range 11/28/2019 15:28  Vitamin D, 25-Hydroxy Latest Ref Range: 30.0 - 100.0 ng/mL 28.6 (L)   Menorrhagia with irregular cycle. Deborah Underwood notes heavy period after missing cycle for 2 months. She notes fatigue. She would like to have her hormones checked. She has a family history of fibroids. Last TSH within normal limits. Hematocrit and hemoglobin within normal limits. MCV and MCH low.  At risk for diabetes mellitus. Deborah Underwood is at higher than average risk for developing diabetes due to PCOS, prediabetes, and obesity.   Assessment/Plan:   Prediabetes. . We discussed Saxenda and Contrave and she declined. She will work on eating complex simple  carbs.  Vitamin D deficiency. She was given a refill on her Vitamin D, Ergocalciferol, (DRISDOL) 1.25 MG (50000 UNIT) CAPS capsule every week #4 with 0 refills.  Menorrhagia with irregular cycle. We provided Deborah Underwood the name of Dr. Gertie Exon (and phone number) and she will make an appointment for evaluation.  At risk for diabetes mellitus. Deborah Underwood was given approximately 15 minutes of diabetes education and counseling today. We discussed intensive lifestyle modifications today with an emphasis on weight loss as well as increasing exercise and decreasing simple carbohydrates in her diet. We also reviewed medication options with an emphasis on risk versus benefit of those discussed.   Repetitive spaced learning was employed today to elicit superior memory formation and behavioral change.  Class 3 severe obesity with serious comorbidity and body mass index (BMI) of 40.0 to 44.9 in adult, unspecified obesity type (HCC).  Yvaine is currently in the action stage of change. As such, her goal is to continue with weight loss efforts. She has agreed to keeping a food journal and adhering to recommended goals of 1500-1600 calories and 100 grams of protein daily.   We discussed the low carb plan. She will stay on journaling for now. She declined [place] for now.  Exercise goals: Deborah Underwood will continue her current exercise regimen.   Behavioral modification strategies: planning for success and keeping a strict food journal.  Deborah Underwood has agreed to follow-up with our clinic fasting in 2-3 weeks.  Objective:   Blood pressure 104/71, pulse 85, temperature 98.4 F (36.9  C), height 5\' 7"  (1.702 m), weight 260 lb (117.9 kg), SpO2 99 %. Body mass index is 40.72 kg/m.  General: Cooperative, alert, well developed, in no acute distress. HEENT: Conjunctivae and lids unremarkable. Cardiovascular: Regular rhythm.  Lungs: Normal work of breathing. Neurologic: No focal deficits.   Lab Results  Component Value Date    CREATININE 0.95 11/28/2019   BUN 14 11/28/2019   NA 140 11/28/2019   K 4.4 11/28/2019   CL 105 11/28/2019   CO2 24 11/28/2019   Lab Results  Component Value Date   ALT 33 (H) 11/28/2019   AST 29 11/28/2019   ALKPHOS 91 11/28/2019   BILITOT 0.3 11/28/2019   Lab Results  Component Value Date   HGBA1C 5.8 (H) 11/28/2019   HGBA1C 5.6 07/19/2019   Lab Results  Component Value Date   INSULIN 10.0 11/28/2019   Lab Results  Component Value Date   TSH 3.070 11/28/2019   Lab Results  Component Value Date   CHOL 199 11/28/2019   HDL 52 11/28/2019   LDLCALC 124 (H) 11/28/2019   TRIG 131 11/28/2019   Lab Results  Component Value Date   WBC 5.9 11/28/2019   HGB 12.0 11/28/2019   HCT 36.7 11/28/2019   MCV 77 (L) 11/28/2019   PLT 426 11/28/2019   No results found for: IRON, TIBC, FERRITIN  Attestation Statements:   Reviewed by clinician on day of visit: allergies, medications, problem list, medical history, surgical history, family history, social history, and previous encounter notes.  I07/17/2021, am acting as Marianna Payment for Energy manager, FNP-C   I have reviewed the above documentation for accuracy and completeness, and I agree with the above. -  [place], FNP

## 2020-03-11 ENCOUNTER — Encounter (INDEPENDENT_AMBULATORY_CARE_PROVIDER_SITE_OTHER): Payer: Self-pay | Admitting: Family Medicine

## 2020-03-11 DIAGNOSIS — S43421A Sprain of right rotator cuff capsule, initial encounter: Secondary | ICD-10-CM | POA: Diagnosis not present

## 2020-03-11 DIAGNOSIS — M5412 Radiculopathy, cervical region: Secondary | ICD-10-CM | POA: Diagnosis not present

## 2020-03-11 DIAGNOSIS — M9901 Segmental and somatic dysfunction of cervical region: Secondary | ICD-10-CM | POA: Diagnosis not present

## 2020-03-11 DIAGNOSIS — M9907 Segmental and somatic dysfunction of upper extremity: Secondary | ICD-10-CM | POA: Diagnosis not present

## 2020-03-11 DIAGNOSIS — N921 Excessive and frequent menstruation with irregular cycle: Secondary | ICD-10-CM | POA: Insufficient documentation

## 2020-03-15 ENCOUNTER — Other Ambulatory Visit (INDEPENDENT_AMBULATORY_CARE_PROVIDER_SITE_OTHER): Payer: Self-pay | Admitting: Family Medicine

## 2020-03-15 DIAGNOSIS — E559 Vitamin D deficiency, unspecified: Secondary | ICD-10-CM

## 2020-03-16 NOTE — Telephone Encounter (Signed)
Deborah Underwood pt °

## 2020-03-17 DIAGNOSIS — S43421A Sprain of right rotator cuff capsule, initial encounter: Secondary | ICD-10-CM | POA: Diagnosis not present

## 2020-03-17 DIAGNOSIS — M9907 Segmental and somatic dysfunction of upper extremity: Secondary | ICD-10-CM | POA: Diagnosis not present

## 2020-03-17 DIAGNOSIS — M5412 Radiculopathy, cervical region: Secondary | ICD-10-CM | POA: Diagnosis not present

## 2020-03-17 DIAGNOSIS — M9901 Segmental and somatic dysfunction of cervical region: Secondary | ICD-10-CM | POA: Diagnosis not present

## 2020-03-24 DIAGNOSIS — S43421A Sprain of right rotator cuff capsule, initial encounter: Secondary | ICD-10-CM | POA: Diagnosis not present

## 2020-03-24 DIAGNOSIS — M9901 Segmental and somatic dysfunction of cervical region: Secondary | ICD-10-CM | POA: Diagnosis not present

## 2020-03-24 DIAGNOSIS — M5412 Radiculopathy, cervical region: Secondary | ICD-10-CM | POA: Diagnosis not present

## 2020-03-24 DIAGNOSIS — M9907 Segmental and somatic dysfunction of upper extremity: Secondary | ICD-10-CM | POA: Diagnosis not present

## 2020-03-25 DIAGNOSIS — M9907 Segmental and somatic dysfunction of upper extremity: Secondary | ICD-10-CM | POA: Diagnosis not present

## 2020-03-25 DIAGNOSIS — M9901 Segmental and somatic dysfunction of cervical region: Secondary | ICD-10-CM | POA: Diagnosis not present

## 2020-03-25 DIAGNOSIS — M5412 Radiculopathy, cervical region: Secondary | ICD-10-CM | POA: Diagnosis not present

## 2020-03-25 DIAGNOSIS — S43421A Sprain of right rotator cuff capsule, initial encounter: Secondary | ICD-10-CM | POA: Diagnosis not present

## 2020-03-30 ENCOUNTER — Other Ambulatory Visit: Payer: Self-pay

## 2020-03-30 ENCOUNTER — Ambulatory Visit (INDEPENDENT_AMBULATORY_CARE_PROVIDER_SITE_OTHER): Payer: BC Managed Care – PPO | Admitting: Family Medicine

## 2020-03-30 ENCOUNTER — Encounter (INDEPENDENT_AMBULATORY_CARE_PROVIDER_SITE_OTHER): Payer: Self-pay | Admitting: Family Medicine

## 2020-03-30 VITALS — BP 127/79 | HR 87 | Temp 97.9°F | Ht 67.0 in | Wt 256.0 lb

## 2020-03-30 DIAGNOSIS — E559 Vitamin D deficiency, unspecified: Secondary | ICD-10-CM

## 2020-03-30 DIAGNOSIS — N921 Excessive and frequent menstruation with irregular cycle: Secondary | ICD-10-CM | POA: Diagnosis not present

## 2020-03-30 DIAGNOSIS — Z6841 Body Mass Index (BMI) 40.0 and over, adult: Secondary | ICD-10-CM

## 2020-03-30 DIAGNOSIS — E7849 Other hyperlipidemia: Secondary | ICD-10-CM | POA: Diagnosis not present

## 2020-03-30 DIAGNOSIS — Z9189 Other specified personal risk factors, not elsewhere classified: Secondary | ICD-10-CM

## 2020-03-30 DIAGNOSIS — M9901 Segmental and somatic dysfunction of cervical region: Secondary | ICD-10-CM | POA: Diagnosis not present

## 2020-03-30 DIAGNOSIS — M9907 Segmental and somatic dysfunction of upper extremity: Secondary | ICD-10-CM | POA: Diagnosis not present

## 2020-03-30 DIAGNOSIS — S43421A Sprain of right rotator cuff capsule, initial encounter: Secondary | ICD-10-CM | POA: Diagnosis not present

## 2020-03-30 DIAGNOSIS — R7303 Prediabetes: Secondary | ICD-10-CM | POA: Diagnosis not present

## 2020-03-30 DIAGNOSIS — M5412 Radiculopathy, cervical region: Secondary | ICD-10-CM | POA: Diagnosis not present

## 2020-03-31 ENCOUNTER — Encounter (INDEPENDENT_AMBULATORY_CARE_PROVIDER_SITE_OTHER): Payer: Self-pay | Admitting: Family Medicine

## 2020-03-31 LAB — COMPREHENSIVE METABOLIC PANEL
ALT: 28 IU/L (ref 0–32)
AST: 18 IU/L (ref 0–40)
Albumin/Globulin Ratio: 1.5 (ref 1.2–2.2)
Albumin: 4.3 g/dL (ref 3.8–4.8)
Alkaline Phosphatase: 104 IU/L (ref 44–121)
BUN/Creatinine Ratio: 22 (ref 9–23)
BUN: 18 mg/dL (ref 6–24)
Bilirubin Total: 0.2 mg/dL (ref 0.0–1.2)
CO2: 20 mmol/L (ref 20–29)
Calcium: 9.2 mg/dL (ref 8.7–10.2)
Chloride: 103 mmol/L (ref 96–106)
Creatinine, Ser: 0.82 mg/dL (ref 0.57–1.00)
GFR calc Af Amer: 97 mL/min/{1.73_m2} (ref >59)
GFR calc non Af Amer: 84 mL/min/{1.73_m2} (ref >59)
Globulin, Total: 2.8 g/dL (ref 1.5–4.5)
Glucose: 95 mg/dL (ref 65–99)
Potassium: 4.7 mmol/L (ref 3.5–5.2)
Sodium: 139 mmol/L (ref 134–144)
Total Protein: 7.1 g/dL (ref 6.0–8.5)

## 2020-03-31 LAB — CBC WITH DIFFERENTIAL/PLATELET
Basophils Absolute: 0.1 10*3/uL (ref 0.0–0.2)
Basos: 1 %
EOS (ABSOLUTE): 0.5 10*3/uL — ABNORMAL HIGH (ref 0.0–0.4)
Eos: 8 %
Hematocrit: 39.3 % (ref 34.0–46.6)
Hemoglobin: 12 g/dL (ref 11.1–15.9)
Immature Grans (Abs): 0 10*3/uL (ref 0.0–0.1)
Immature Granulocytes: 0 %
Lymphocytes Absolute: 1.9 10*3/uL (ref 0.7–3.1)
Lymphs: 29 %
MCH: 21.7 pg — ABNORMAL LOW (ref 26.6–33.0)
MCHC: 30.5 g/dL — ABNORMAL LOW (ref 31.5–35.7)
MCV: 71 fL — ABNORMAL LOW (ref 79–97)
Monocytes Absolute: 0.4 10*3/uL (ref 0.1–0.9)
Monocytes: 7 %
Neutrophils Absolute: 3.8 10*3/uL (ref 1.4–7.0)
Neutrophils: 55 %
Platelets: 454 10*3/uL — ABNORMAL HIGH (ref 150–450)
RBC: 5.54 x10E6/uL — ABNORMAL HIGH (ref 3.77–5.28)
RDW: 15.8 % — ABNORMAL HIGH (ref 11.7–15.4)
WBC: 6.7 10*3/uL (ref 3.4–10.8)

## 2020-03-31 LAB — INSULIN, RANDOM: INSULIN: 12.9 u[IU]/mL (ref 2.6–24.9)

## 2020-03-31 LAB — VITAMIN D 25 HYDROXY (VIT D DEFICIENCY, FRACTURES): Vit D, 25-Hydroxy: 46.6 ng/mL (ref 30.0–100.0)

## 2020-03-31 LAB — FERRITIN: Ferritin: 7 ng/mL — ABNORMAL LOW (ref 15–150)

## 2020-03-31 LAB — LIPID PANEL WITH LDL/HDL RATIO
Cholesterol, Total: 218 mg/dL — ABNORMAL HIGH (ref 100–199)
HDL: 57 mg/dL (ref >39)
LDL Chol Calc (NIH): 142 mg/dL — ABNORMAL HIGH (ref 0–99)
LDL/HDL Ratio: 2.5 ratio (ref 0.0–3.2)
Triglycerides: 109 mg/dL (ref 0–149)
VLDL Cholesterol Cal: 19 mg/dL (ref 5–40)

## 2020-03-31 LAB — IRON AND TIBC
Iron Saturation: 6 % — CL (ref 15–55)
Iron: 27 ug/dL (ref 27–159)
Total Iron Binding Capacity: 429 ug/dL (ref 250–450)
UIBC: 402 ug/dL (ref 131–425)

## 2020-03-31 LAB — VITAMIN B12: Vitamin B-12: 1487 pg/mL — ABNORMAL HIGH (ref 232–1245)

## 2020-03-31 LAB — FOLATE: Folate: 11.1 ng/mL (ref >3.0)

## 2020-03-31 LAB — HEMOGLOBIN A1C
Est. average glucose Bld gHb Est-mCnc: 117 mg/dL
Hgb A1c MFr Bld: 5.7 % — ABNORMAL HIGH (ref 4.8–5.6)

## 2020-03-31 NOTE — Progress Notes (Signed)
Chief Complaint:   OBESITY Deborah Underwood is here to discuss her progress with her obesity treatment plan along with follow-up of her obesity related diagnoses. Deborah Underwood is on keeping a food journal and adhering to recommended goals of 1500-1600 calories and 100 grams of protein daily and states she is following her eating plan approximately 20% of the time. Deborah Underwood states she is playing volleyball for 30-45 minutes 2 times per week.  Today's visit was #: 9 Starting weight: 278 lbs Starting date: 11/28/2019 Today's weight: 256 lbs Today's date: 03/30/2020 Total lbs lost to date: 22 Total lbs lost since last in-office visit: 4  Interim History: Liliann is down 4 lbs today and 22 lbs total since July. She notes she went over on carbohydrates and calories a few times but overall did well. She has some questions about non-nutritive sweeteners.  Subjective:   1. Pre-diabetes Deborah Underwood's last A1c was 5.8. She is not on metformin. She denies polyphagia.  Lab Results  Component Value Date   HGBA1C 5.7 (H) 03/30/2020   Lab Results  Component Value Date   INSULIN 12.9 03/30/2020   INSULIN 10.0 11/28/2019   2. Other hyperlipidemia Deborah Underwood's last LDL was elevated at 124 on 11/28/2019, and triglycerides and HDL were within normal limits. She is not on statin.   Lab Results  Component Value Date   ALT 28 03/30/2020   AST 18 03/30/2020   ALKPHOS 104 03/30/2020   BILITOT 0.2 03/30/2020   Lab Results  Component Value Date   CHOL 218 (H) 03/30/2020   HDL 57 03/30/2020   LDLCALC 142 (H) 03/30/2020   TRIG 109 03/30/2020   3. Vitamin D deficiency Deborah Underwood's last Vit D level was low at 28.6. She is on weekly prescription Vit D.  4. Menorrhagia with irregular cycle Deborah Underwood's last MCV and MCT were low. She has had some heavy periods and her periods are irregular at times. She is not on iron supplementation.  5. At risk for diabetes mellitus Deborah Underwood is at higher than average risk for developing diabetes due to obesity.     Assessment/Plan:   1. Pre-diabetes  We will check labs today.  - Hemoglobin A1c - Insulin, random - Comprehensive metabolic panel  2. Other hyperlipidemia  We will check labs today.  - Lipid Panel With LDL/HDL Ratio  3. Vitamin D deficiency . We will check labs today.   - VITAMIN D 25 Hydroxy (Vit-D Deficiency, Fractures)  4. Menorrhagia with irregular cycle We will check labs today, and Deborah Underwood will follow up as directed.  - CBC with Differential/Platelet - Vitamin B12 - Folate - Iron and TIBC - Ferritin  5. At risk for diabetes mellitus Deborah Underwood was given approximately 15 minutes of diabetes education and counseling today. We discussed intensive lifestyle modifications today with an emphasis on weight loss as well as increasing exercise and decreasing simple carbohydrates in her diet. We also reviewed medication options with an emphasis on risk versus benefit of those discussed.   Repetitive spaced learning was employed today to elicit superior memory formation and behavioral change.  6. Class 3 severe obesity with serious comorbidity and body mass index (BMI) of 40.0 to 44.9 in adult, unspecified obesity type (HCC) Deborah Underwood is currently in the action stage of change. As such, her goal is to continue with weight loss efforts. She has agreed to keeping a food journal and adhering to recommended goals of 1500-1600 calories and 100 grams of protein, carbs <150.   Exercise goals: As  is.  Behavioral modification strategies: holiday eating strategies .  Deborah Underwood has agreed to follow-up with our clinic in 3 weeks.  Deborah Underwood was informed we would discuss her lab results at her next visit unless there is a critical issue that needs to be addressed sooner. Deborah Underwood agreed to keep her next visit at the agreed upon time to discuss these results.  Objective:   Blood pressure 127/79, pulse 87, temperature 97.9 F (36.6 C), height 5\' 7"  (1.702 m), weight 256 lb (116.1 kg), SpO2 98 %. Body mass index is  40.1 kg/m.  General: Cooperative, alert, well developed, in no acute distress. HEENT: Conjunctivae and lids unremarkable. Cardiovascular: Regular rhythm.  Lungs: Normal work of breathing. Neurologic: No focal deficits.   Lab Results  Component Value Date   CREATININE 0.82 03/30/2020   BUN 18 03/30/2020   NA 139 03/30/2020   K 4.7 03/30/2020   CL 103 03/30/2020   CO2 20 03/30/2020   Lab Results  Component Value Date   ALT 28 03/30/2020   AST 18 03/30/2020   ALKPHOS 104 03/30/2020   BILITOT 0.2 03/30/2020   Lab Results  Component Value Date   HGBA1C 5.7 (H) 03/30/2020   HGBA1C 5.8 (H) 11/28/2019   HGBA1C 5.6 07/19/2019   Lab Results  Component Value Date   INSULIN 12.9 03/30/2020   INSULIN 10.0 11/28/2019   Lab Results  Component Value Date   TSH 3.070 11/28/2019   Lab Results  Component Value Date   CHOL 218 (H) 03/30/2020   HDL 57 03/30/2020   LDLCALC 142 (H) 03/30/2020   TRIG 109 03/30/2020   Lab Results  Component Value Date   WBC 6.7 03/30/2020   HGB 12.0 03/30/2020   HCT 39.3 03/30/2020   MCV 71 (L) 03/30/2020   PLT 454 (H) 03/30/2020   Lab Results  Component Value Date   IRON 27 03/30/2020   TIBC 429 03/30/2020   FERRITIN 7 (L) 03/30/2020   Attestation Statements:   Reviewed by clinician on day of visit: allergies, medications, problem list, medical history, surgical history, family history, social history, and previous encounter notes.   04/01/2020, am acting as Trude Mcburney for Energy manager, FNP-C.  I have reviewed the above documentation for accuracy and completeness, and I agree with the above. -  [place], FNP

## 2020-03-31 NOTE — Telephone Encounter (Signed)
Please advise 

## 2020-04-01 ENCOUNTER — Encounter (INDEPENDENT_AMBULATORY_CARE_PROVIDER_SITE_OTHER): Payer: Self-pay | Admitting: Family Medicine

## 2020-04-01 DIAGNOSIS — M9901 Segmental and somatic dysfunction of cervical region: Secondary | ICD-10-CM | POA: Diagnosis not present

## 2020-04-01 DIAGNOSIS — M9907 Segmental and somatic dysfunction of upper extremity: Secondary | ICD-10-CM | POA: Diagnosis not present

## 2020-04-01 DIAGNOSIS — M5412 Radiculopathy, cervical region: Secondary | ICD-10-CM | POA: Diagnosis not present

## 2020-04-01 DIAGNOSIS — S43421A Sprain of right rotator cuff capsule, initial encounter: Secondary | ICD-10-CM | POA: Diagnosis not present

## 2020-04-01 DIAGNOSIS — E7849 Other hyperlipidemia: Secondary | ICD-10-CM | POA: Insufficient documentation

## 2020-04-01 NOTE — Telephone Encounter (Signed)
FYI

## 2020-04-06 DIAGNOSIS — M9907 Segmental and somatic dysfunction of upper extremity: Secondary | ICD-10-CM | POA: Diagnosis not present

## 2020-04-06 DIAGNOSIS — M9901 Segmental and somatic dysfunction of cervical region: Secondary | ICD-10-CM | POA: Diagnosis not present

## 2020-04-06 DIAGNOSIS — M5412 Radiculopathy, cervical region: Secondary | ICD-10-CM | POA: Diagnosis not present

## 2020-04-06 DIAGNOSIS — S43421A Sprain of right rotator cuff capsule, initial encounter: Secondary | ICD-10-CM | POA: Diagnosis not present

## 2020-04-08 DIAGNOSIS — S43421A Sprain of right rotator cuff capsule, initial encounter: Secondary | ICD-10-CM | POA: Diagnosis not present

## 2020-04-08 DIAGNOSIS — M9901 Segmental and somatic dysfunction of cervical region: Secondary | ICD-10-CM | POA: Diagnosis not present

## 2020-04-08 DIAGNOSIS — M9907 Segmental and somatic dysfunction of upper extremity: Secondary | ICD-10-CM | POA: Diagnosis not present

## 2020-04-08 DIAGNOSIS — M5412 Radiculopathy, cervical region: Secondary | ICD-10-CM | POA: Diagnosis not present

## 2020-04-13 DIAGNOSIS — S43421A Sprain of right rotator cuff capsule, initial encounter: Secondary | ICD-10-CM | POA: Diagnosis not present

## 2020-04-13 DIAGNOSIS — M9907 Segmental and somatic dysfunction of upper extremity: Secondary | ICD-10-CM | POA: Diagnosis not present

## 2020-04-13 DIAGNOSIS — M9901 Segmental and somatic dysfunction of cervical region: Secondary | ICD-10-CM | POA: Diagnosis not present

## 2020-04-13 DIAGNOSIS — M5412 Radiculopathy, cervical region: Secondary | ICD-10-CM | POA: Diagnosis not present

## 2020-04-15 DIAGNOSIS — M5412 Radiculopathy, cervical region: Secondary | ICD-10-CM | POA: Diagnosis not present

## 2020-04-15 DIAGNOSIS — M9907 Segmental and somatic dysfunction of upper extremity: Secondary | ICD-10-CM | POA: Diagnosis not present

## 2020-04-15 DIAGNOSIS — S43421A Sprain of right rotator cuff capsule, initial encounter: Secondary | ICD-10-CM | POA: Diagnosis not present

## 2020-04-15 DIAGNOSIS — M9901 Segmental and somatic dysfunction of cervical region: Secondary | ICD-10-CM | POA: Diagnosis not present

## 2020-04-16 ENCOUNTER — Other Ambulatory Visit: Payer: Self-pay

## 2020-04-16 ENCOUNTER — Ambulatory Visit (INDEPENDENT_AMBULATORY_CARE_PROVIDER_SITE_OTHER): Payer: BC Managed Care – PPO | Admitting: Family Medicine

## 2020-04-16 ENCOUNTER — Encounter (INDEPENDENT_AMBULATORY_CARE_PROVIDER_SITE_OTHER): Payer: Self-pay | Admitting: Family Medicine

## 2020-04-16 VITALS — BP 128/81 | HR 68 | Temp 98.1°F | Ht 67.0 in | Wt 255.0 lb

## 2020-04-16 DIAGNOSIS — Z6841 Body Mass Index (BMI) 40.0 and over, adult: Secondary | ICD-10-CM | POA: Diagnosis not present

## 2020-04-16 DIAGNOSIS — R790 Abnormal level of blood mineral: Secondary | ICD-10-CM

## 2020-04-16 DIAGNOSIS — E559 Vitamin D deficiency, unspecified: Secondary | ICD-10-CM | POA: Diagnosis not present

## 2020-04-16 MED ORDER — VITAMIN D (ERGOCALCIFEROL) 1.25 MG (50000 UNIT) PO CAPS: 50000.0000 [IU] | ORAL_CAPSULE | ORAL | 0 refills | Status: DC

## 2020-04-16 NOTE — Progress Notes (Signed)
Chief Complaint:   OBESITY Deborah Underwood is here to discuss her progress with her obesity treatment plan along with follow-up of her obesity related diagnoses. Deborah Underwood is on keeping a food journal and adhering to recommended goals of 1500-1600 calories and 100 grams of protein and states she is following her eating plan approximately 80% of the time. Deborah Underwood states she is not exercising regularly.  Today's visit was #: 10 Starting weight: 278 lbs Starting date: 11/28/2019 Today's weight: 255 lbs Today's date: 04/16/2020 Total lbs lost to date: 23 lbs Total lbs lost since last in-office visit: 1 lb  Interim History: Deborah Underwood is journaling consistently.  She had some sweets over the holidays, but did not feel it triggered more hunger.  Calories and protein goals are met most days, but she did exceed calorie goal by 500 on one day.    Subjective:   1. Vitamin D deficiency Vitamin D recently at goal (46.6), which is up from 28.6.  She is on prescription vitamin D.  2. Low iron stores Iron saturation and ferritin both low.  She reports being perimenopausal and periods are irregular, but can be heavy.  She is not currently on iron.  Hgb normal at 12.0.  CBC Latest Ref Rng & Units 03/30/2020 11/28/2019 08/02/2019  WBC 3.4 - 10.8 x10E3/uL 6.7 5.9 8.4  Hemoglobin 11.1 - 15.9 g/dL 12.0 12.0 12.9  Hematocrit 34.0 - 46.6 % 39.3 36.7 39.8  Platelets 150 - 450 x10E3/uL 454(H) 426 422(H)   Lab Results  Component Value Date   IRON 27 03/30/2020   TIBC 429 03/30/2020   FERRITIN 7 (L) 03/30/2020   Lab Results  Component Value Date   VITAMINB12 1,487 (H) 03/30/2020   Assessment/Plan:   1. Vitamin D deficiency Will refill vitamin D, as per below.  -Refill Vitamin D, Ergocalciferol, (DRISDOL) 1.25 MG (50000 UNIT) CAPS capsule; Take 1 capsule (50,000 Units total) by mouth every 7 (seven) days.  Dispense: 4 capsule; Refill: 0  2. Low iron stores Add iron to regimen at next office visit.  Contact info  provided for Dr. Sumner Boast (GYN) at last office visit.  3. Class 3 severe obesity with serious comorbidity and body mass index (BMI) of 40.0 to 44.9 in adult, unspecified obesity type (HCC)  Deborah Underwood is currently in the action stage of change. As such, her goal is to continue with weight loss efforts. She has agreed to keeping a food journal and adhering to recommended goals of 1500-1600 calories and 100 grams of protein daily.   Information provided about non-nutritive sweeteners. Pt had asked for information  at last visit.  Exercise goals: No exercise has been prescribed at this time.  Behavioral modification strategies: increasing water intake and planning for success.  [name] has agreed to follow-up with our clinic in 3 weeks.   Objective:   Blood pressure 128/81, pulse 68, temperature 98.1 F (36.7 C), height 5' 7"  (1.702 m), weight 255 lb (115.7 kg), SpO2 97 %. Body mass index is 39.94 kg/m.  General: Cooperative, alert, well developed, in no acute distress. HEENT: Conjunctivae and lids unremarkable. Cardiovascular: Regular rhythm.  Lungs: Normal work of breathing. Neurologic: No focal deficits.   Lab Results  Component Value Date   CREATININE 0.82 03/30/2020   BUN 18 03/30/2020   NA 139 03/30/2020   K 4.7 03/30/2020   CL 103 03/30/2020   CO2 20 03/30/2020   Lab Results  Component Value Date   ALT 28 03/30/2020  AST 18 03/30/2020   ALKPHOS 104 03/30/2020   BILITOT 0.2 03/30/2020   Lab Results  Component Value Date   HGBA1C 5.7 (H) 03/30/2020   HGBA1C 5.8 (H) 11/28/2019   HGBA1C 5.6 07/19/2019   Lab Results  Component Value Date   INSULIN 12.9 03/30/2020   INSULIN 10.0 11/28/2019   Lab Results  Component Value Date   TSH 3.070 11/28/2019   Lab Results  Component Value Date   CHOL 218 (H) 03/30/2020   HDL 57 03/30/2020   LDLCALC 142 (H) 03/30/2020   TRIG 109 03/30/2020   Lab Results  Component Value Date   WBC 6.7 03/30/2020   HGB 12.0 03/30/2020    HCT 39.3 03/30/2020   MCV 71 (L) 03/30/2020   PLT 454 (H) 03/30/2020   Lab Results  Component Value Date   IRON 27 03/30/2020   TIBC 429 03/30/2020   FERRITIN 7 (L) 03/30/2020   Attestation Statements:   Reviewed by clinician on day of visit: allergies, medications, problem list, medical history, surgical history, family history, social history, and previous encounter notes.  I, Water quality scientist, CMA, am acting as Location manager for [employer], [place].  I have reviewed the above documentation for accuracy and completeness, and I agree with the above. -  Georgianne Fick, FNP

## 2020-04-20 DIAGNOSIS — M9907 Segmental and somatic dysfunction of upper extremity: Secondary | ICD-10-CM | POA: Diagnosis not present

## 2020-04-20 DIAGNOSIS — M5412 Radiculopathy, cervical region: Secondary | ICD-10-CM | POA: Diagnosis not present

## 2020-04-20 DIAGNOSIS — S43421A Sprain of right rotator cuff capsule, initial encounter: Secondary | ICD-10-CM | POA: Diagnosis not present

## 2020-04-20 DIAGNOSIS — M9901 Segmental and somatic dysfunction of cervical region: Secondary | ICD-10-CM | POA: Diagnosis not present

## 2020-05-06 ENCOUNTER — Ambulatory Visit (INDEPENDENT_AMBULATORY_CARE_PROVIDER_SITE_OTHER): Payer: BC Managed Care – PPO | Admitting: Family Medicine

## 2020-05-06 ENCOUNTER — Encounter (INDEPENDENT_AMBULATORY_CARE_PROVIDER_SITE_OTHER): Payer: Self-pay | Admitting: Family Medicine

## 2020-05-06 ENCOUNTER — Other Ambulatory Visit: Payer: Self-pay

## 2020-05-06 VITALS — BP 118/72 | HR 82 | Temp 98.4°F | Ht 67.0 in | Wt 258.0 lb

## 2020-05-06 DIAGNOSIS — F32A Depression, unspecified: Secondary | ICD-10-CM | POA: Insufficient documentation

## 2020-05-06 DIAGNOSIS — E559 Vitamin D deficiency, unspecified: Secondary | ICD-10-CM

## 2020-05-06 DIAGNOSIS — D649 Anemia, unspecified: Secondary | ICD-10-CM | POA: Insufficient documentation

## 2020-05-06 DIAGNOSIS — F3289 Other specified depressive episodes: Secondary | ICD-10-CM

## 2020-05-06 DIAGNOSIS — D508 Other iron deficiency anemias: Secondary | ICD-10-CM

## 2020-05-06 DIAGNOSIS — Z6841 Body Mass Index (BMI) 40.0 and over, adult: Secondary | ICD-10-CM

## 2020-05-06 MED ORDER — VITAMIN D (ERGOCALCIFEROL) 1.25 MG (50000 UNIT) PO CAPS: 50000.0000 [IU] | ORAL_CAPSULE | ORAL | 0 refills | Status: DC

## 2020-05-06 MED ORDER — IRON 325 (65 FE) MG PO TABS: 1.0000 | ORAL_TABLET | Freq: Every day | ORAL | 0 refills | Status: AC

## 2020-05-06 NOTE — Progress Notes (Signed)
Chief Complaint:   OBESITY Deborah Underwood is here to discuss her progress with her obesity treatment plan along with follow-up of her obesity related diagnoses. Deborah Underwood is on keeping a food journal and adhering to recommended goals of 1500-1600 calories and 100 grams of protein and states she is following her eating plan approximately 20% of the time. Deborah Underwood states she is walking for 40 minutes 5 times per week.  Today's visit was #: 11 Starting weight: 278 lbs Starting date: 11/28/2019 Today's weight: 258 lbs Today's date: 05/06/2020 Total lbs lost to date: 20 lbs Total lbs lost since last in-office visit: 0  Interim History: Deborah Underwood has been struggling with eating mindlessly at times.  If she eats poorly at a meal this tends to continue the rest of the day. The holiday has gotten her off plan.  She notes a pattern of losing and regaining weight in her past. She has a question about supplementing with berberine for insulin resistance.  Subjective:   1. Vitamin D deficiency Vitamin D is slightly low at 46.6.  She is forgetting to take it at times.  2. Other iron deficiency anemia She is tolerating oral iron. Denies constipation.  Last ferritin low at 7, and iron saturation low (6), high 12. She has had some heavy periods and her periods are irregular at times  CBC Latest Ref Rng & Units 03/30/2020 11/28/2019 08/02/2019  WBC 3.4 - 10.8 x10E3/uL 6.7 5.9 8.4  Hemoglobin 11.1 - 15.9 g/dL 63.8 46.6 59.9  Hematocrit 34.0 - 46.6 % 39.3 36.7 39.8  Platelets 150 - 450 x10E3/uL 454(H) 426 422(H)   Lab Results  Component Value Date   IRON 27 03/30/2020   TIBC 429 03/30/2020   FERRITIN 7 (L) 03/30/2020   Lab Results  Component Value Date   VITAMINB12 1,487 (H) 03/30/2020   3. Other depression, with emotional eating She notices that she eats for many reasons other than hunger.  She tends to continue to be off plan after eating off plan one meal. She did see Dr. Dewaine Conger for a few visits in the past.   Assessment/Plan:   1. Vitamin D deficiency Refill vitamin D, as per below.  - Vitamin D, Ergocalciferol, (DRISDOL) 1.25 MG (50000 UNIT) CAPS capsule; Take 1 capsule (50,000 Units total) by mouth every 7 (seven) days.  Dispense: 12 capsule; Refill: 0  2. Other iron deficiency anemia Continue iron 325 mg once daily.  Orders and follow up as documented in patient record.  3. Other depression, with emotional eating Discussed bupropion, and she will consider this. I advised her to journal all food even if she is off plan. We also discussed the disadvantages if having an "all or nothing" attitude.   4. Class 3 severe obesity with serious comorbidity and body mass index (BMI) of 40.0 to 44.9 in adult, unspecified obesity type (HCC)  Deborah Underwood is currently in the action stage of change. As such, her goal is to continue with weight loss efforts. She has agreed to keeping a food journal and adhering to recommended goals of 1500-1600 calories and 100 grams of protein daily.   I will research berberine and we will discuss more at next OV.  Exercise goals: Work toward more purposeful physical activity.  Behavioral modification strategies: decreasing simple carbohydrates and emotional eating strategies.  Bettye has agreed to follow-up with our clinic in 3 weeks.   Objective:   Blood pressure 118/72, pulse 82, temperature 98.4 F (36.9 C), height 5\' 7"  (1.702  m), weight 258 lb (117 kg), SpO2 98 %. Body mass index is 40.41 kg/m.  General: Cooperative, alert, well developed, in no acute distress. HEENT: Conjunctivae and lids unremarkable. Cardiovascular: Regular rhythm.  Lungs: Normal work of breathing. Neurologic: No focal deficits.   Lab Results  Component Value Date   CREATININE 0.82 03/30/2020   BUN 18 03/30/2020   NA 139 03/30/2020   K 4.7 03/30/2020   CL 103 03/30/2020   CO2 20 03/30/2020   Lab Results  Component Value Date   ALT 28 03/30/2020   AST 18 03/30/2020   ALKPHOS 104  03/30/2020   BILITOT 0.2 03/30/2020   Lab Results  Component Value Date   HGBA1C 5.7 (H) 03/30/2020   HGBA1C 5.8 (H) 11/28/2019   HGBA1C 5.6 07/19/2019   Lab Results  Component Value Date   INSULIN 12.9 03/30/2020   INSULIN 10.0 11/28/2019   Lab Results  Component Value Date   TSH 3.070 11/28/2019   Lab Results  Component Value Date   CHOL 218 (H) 03/30/2020   HDL 57 03/30/2020   LDLCALC 142 (H) 03/30/2020   TRIG 109 03/30/2020   Lab Results  Component Value Date   WBC 6.7 03/30/2020   HGB 12.0 03/30/2020   HCT 39.3 03/30/2020   MCV 71 (L) 03/30/2020   PLT 454 (H) 03/30/2020   Lab Results  Component Value Date   IRON 27 03/30/2020   TIBC 429 03/30/2020   FERRITIN 7 (L) 03/30/2020   Attestation Statements:   Reviewed by clinician on day of visit: allergies, medications, problem list, medical history, surgical history, family history, social history, and previous encounter notes.  I, Insurance claims handler, CMA, am acting as Energy manager for [place], FNP.  I have reviewed the above documentation for accuracy and completeness, and I agree with the above. -  Jesse Sans, FNP

## 2020-05-26 ENCOUNTER — Telehealth (INDEPENDENT_AMBULATORY_CARE_PROVIDER_SITE_OTHER): Payer: BC Managed Care – PPO | Admitting: Family Medicine

## 2020-05-26 ENCOUNTER — Encounter (INDEPENDENT_AMBULATORY_CARE_PROVIDER_SITE_OTHER): Payer: Self-pay | Admitting: Family Medicine

## 2020-05-26 DIAGNOSIS — R7303 Prediabetes: Secondary | ICD-10-CM

## 2020-05-26 DIAGNOSIS — Z6841 Body Mass Index (BMI) 40.0 and over, adult: Secondary | ICD-10-CM | POA: Diagnosis not present

## 2020-06-01 ENCOUNTER — Encounter (INDEPENDENT_AMBULATORY_CARE_PROVIDER_SITE_OTHER): Payer: Self-pay | Admitting: Family Medicine

## 2020-06-01 NOTE — Progress Notes (Addendum)
TeleHealth Visit:  Due to the COVID-19 pandemic, this visit was completed with telemedicine (audio/video) technology to reduce patient and provider exposure as well as to preserve personal protective equipment.   Deborah Underwood has verbally consented to this TeleHealth visit. The patient is located at home, the provider is located at the [facility] office. The participants in this visit include the listed provider and patient. [facility] was unable to use realtime audiovisual technology today and the telehealth visit was conducted via telephone (21 minutes).   Chief Complaint: OBESITY Deborah Underwood is here to discuss her progress with her obesity treatment plan along with follow-up of her obesity related diagnoses. Deborah Underwood is on keeping a food journal and adhering to recommended goals of 1500-1600 calories and 100 grams of protein daily and states she is following her eating plan approximately 40% of the time. Deborah Underwood states she is walking 20 minutes 3 times per week.  Today's visit was #: 12 Starting weight: 278 lbs Starting date: 11/28/2019  Interim History: Deborah Underwood's visit was converted to a phone call after video was attempted. She notes being able to get into a good food regimen during Christmas. She feels she has maintained her weight. She journals 70-80% of the time. She is meeting her protein goals. Her weight at home today is 260.2 lbs.  Subjective:   1. Pre-diabetes Deborah Underwood's last A1c was 5.7, and she is not on metformin.   Lab Results  Component Value Date   HGBA1C 5.7 (H) 03/30/2020   Lab Results  Component Value Date   INSULIN 12.9 03/30/2020   INSULIN 10.0 11/28/2019   Assessment/Plan:   1. Pre-diabetes Deborah Underwood will continue her meal plan, and will continue to work on weight loss, exercise, and decreasing simple carbohydrates to help decrease the risk of diabetes.   2. Class 3 severe obesity with serious comorbidity and body mass index (BMI) of 40.0 to 44.9 in adult, unspecified obesity  type (HCC) Deborah Underwood is currently in the action stage of change. As such, her goal is to continue with weight loss efforts. She has agreed to keeping a food journal and adhering to recommended goals of 1500-1600 calories and 100 grams of protein daily.   Exercise goals: As is.  Behavioral modification strategies: increasing lean protein intake and decreasing simple carbohydrates.  Deborah Underwood has agreed to follow-up with our clinic in 3 weeks.  Objective:   VITALS: Per patient if applicable, see vitals. GENERAL: Alert and in no acute distress. CARDIOPULMONARY: No increased WOB. Speaking in clear sentences.  PSYCH: Pleasant and cooperative. Speech normal rate and rhythm. Affect is appropriate. Insight and judgement are appropriate. Attention is focused, linear, and appropriate.  NEURO: Oriented as arrived to appointment on time with no prompting.   Lab Results  Component Value Date   CREATININE 0.82 03/30/2020   BUN 18 03/30/2020   NA 139 03/30/2020   K 4.7 03/30/2020   CL 103 03/30/2020   CO2 20 03/30/2020   Lab Results  Component Value Date   ALT 28 03/30/2020   AST 18 03/30/2020   ALKPHOS 104 03/30/2020   BILITOT 0.2 03/30/2020   Lab Results  Component Value Date   HGBA1C 5.7 (H) 03/30/2020   HGBA1C 5.8 (H) 11/28/2019   HGBA1C 5.6 07/19/2019   Lab Results  Component Value Date   INSULIN 12.9 03/30/2020   INSULIN 10.0 11/28/2019   Lab Results  Component Value Date   TSH 3.070 11/28/2019   Lab Results  Component Value Date  CHOL 218 (H) 03/30/2020   HDL 57 03/30/2020   LDLCALC 142 (H) 03/30/2020   TRIG 109 03/30/2020   Lab Results  Component Value Date   WBC 6.7 03/30/2020   HGB 12.0 03/30/2020   HCT 39.3 03/30/2020   MCV 71 (L) 03/30/2020   PLT 454 (H) 03/30/2020   Lab Results  Component Value Date   IRON 27 03/30/2020   TIBC 429 03/30/2020   FERRITIN 7 (L) 03/30/2020    Attestation Statements:   Reviewed by clinician on day of visit: allergies,  medications, problem list, medical history, surgical history, family history, social history, and previous encounter notes.   Trude Mcburney, am acting as Energy manager for [place], FNP-C.  I have reviewed the above documentation for accuracy and completeness, and I agree with the above. - Jesse Sans, FNP

## 2020-06-07 ENCOUNTER — Other Ambulatory Visit: Payer: Self-pay | Admitting: Family Medicine

## 2020-06-07 DIAGNOSIS — R600 Localized edema: Secondary | ICD-10-CM

## 2020-06-08 NOTE — Telephone Encounter (Signed)
Refill request for:   Furorsemide 20 mg  LR    D/c 715/21  LOV 11/01/19 FOV none scheduled.   Please review and advise.  Thanks.  Dm/cma

## 2020-06-17 ENCOUNTER — Ambulatory Visit (INDEPENDENT_AMBULATORY_CARE_PROVIDER_SITE_OTHER): Payer: BC Managed Care – PPO | Admitting: Family Medicine

## 2020-07-02 DIAGNOSIS — Z8041 Family history of malignant neoplasm of ovary: Secondary | ICD-10-CM | POA: Diagnosis not present

## 2020-07-02 DIAGNOSIS — N958 Other specified menopausal and perimenopausal disorders: Secondary | ICD-10-CM | POA: Diagnosis not present

## 2020-07-02 DIAGNOSIS — N926 Irregular menstruation, unspecified: Secondary | ICD-10-CM | POA: Diagnosis not present

## 2020-07-06 ENCOUNTER — Other Ambulatory Visit: Payer: Self-pay

## 2020-07-07 ENCOUNTER — Encounter: Payer: Self-pay | Admitting: Family Medicine

## 2020-07-07 ENCOUNTER — Ambulatory Visit: Payer: BC Managed Care – PPO | Admitting: Family Medicine

## 2020-07-07 VITALS — BP 130/80 | HR 81 | Resp 16 | Ht 67.0 in | Wt 263.0 lb

## 2020-07-07 DIAGNOSIS — E559 Vitamin D deficiency, unspecified: Secondary | ICD-10-CM

## 2020-07-07 DIAGNOSIS — E538 Deficiency of other specified B group vitamins: Secondary | ICD-10-CM | POA: Diagnosis not present

## 2020-07-07 DIAGNOSIS — R413 Other amnesia: Secondary | ICD-10-CM

## 2020-07-07 DIAGNOSIS — R7303 Prediabetes: Secondary | ICD-10-CM | POA: Diagnosis not present

## 2020-07-07 LAB — CBC
HCT: 41.6 % (ref 36.0–46.0)
Hemoglobin: 13.9 g/dL (ref 12.0–15.0)
MCHC: 33.3 g/dL (ref 30.0–36.0)
MCV: 78.3 fl (ref 78.0–100.0)
Platelets: 351 10*3/uL (ref 150.0–400.0)
RBC: 5.32 Mil/uL — ABNORMAL HIGH (ref 3.87–5.11)
RDW: 19.4 % — ABNORMAL HIGH (ref 11.5–15.5)
WBC: 8.7 10*3/uL (ref 4.0–10.5)

## 2020-07-07 LAB — COMPREHENSIVE METABOLIC PANEL
ALT: 13 U/L (ref 0–35)
AST: 12 U/L (ref 0–37)
Albumin: 3.9 g/dL (ref 3.5–5.2)
Alkaline Phosphatase: 68 U/L (ref 39–117)
BUN: 18 mg/dL (ref 6–23)
CO2: 26 mEq/L (ref 19–32)
Calcium: 9 mg/dL (ref 8.4–10.5)
Chloride: 103 mEq/L (ref 96–112)
Creatinine, Ser: 0.83 mg/dL (ref 0.40–1.20)
GFR: 82.56 mL/min (ref >60.00)
Glucose, Bld: 78 mg/dL (ref 70–99)
Potassium: 4.3 mEq/L (ref 3.5–5.1)
Sodium: 137 mEq/L (ref 135–145)
Total Bilirubin: 0.3 mg/dL (ref 0.2–1.2)
Total Protein: 7 g/dL (ref 6.0–8.3)

## 2020-07-07 LAB — VITAMIN B12: Vitamin B-12: 546 pg/mL (ref 211–911)

## 2020-07-07 LAB — VITAMIN D 25 HYDROXY (VIT D DEFICIENCY, FRACTURES): VITD: 34.73 ng/mL (ref 30.00–100.00)

## 2020-07-07 LAB — HEMOGLOBIN A1C: Hgb A1c MFr Bld: 5.5 % (ref 4.6–6.5)

## 2020-07-07 LAB — TSH: TSH: 3.99 u[IU]/mL (ref 0.35–4.50)

## 2020-07-07 NOTE — Progress Notes (Signed)
Chief Complaint  Patient presents with   Memory Loss   HPI: [name] is a 50 y.o. female, who is here today with above concern. For a couple years she has not felt as "sharp" as her usual, it seems to be worse for the past 2 weeks. Her mother was recently dx'ed with cancer and starting chemo. Today she went to a different address when trying to be here for this appt. She has made some mistakes at work, no serious implications but embarrassing.  Negative for unusual/frequent/severe headache, fever,abnormal wt loss,night sweats,or focal neurologic deficit. Hx of depression and anxiety, she does not think these problems are causing problem. Sleeping about 7 hours. She feels rested. No known OSA. FHx MGM with Alzheimer's disease.  Hx of insulin resistant, she wonders if this causing problems with cognition. Until a few months ago she was following with [facility] clinic. Prediabetes. HgA1C 5.7 in 03/2020. Negative for abdominal pain, nausea,vomiting, polydipsia,polyuria, or polyphagia.  She has not been consistent with following a healthful diet but she is trying to go back. She is not exercising regularly.  Hx of B12 deficiency: Not on B12 supplementation. B12 elevated at 1487 in 03/2020.  Vit D def on Ergocalciferol 50,000 U weekly. 25 OH vit D 46 (28) in 03/2020.  Review of Systems  Constitutional: Negative for activity change and appetite change.  HENT: Negative for mouth sores and sore throat.   Eyes: Negative for redness and visual disturbance.  Respiratory: Negative for cough, shortness of breath and wheezing.   Cardiovascular: Negative for chest pain, palpitations and leg swelling.  Gastrointestinal: Negative for vomiting.       Negative for changes in bowel habits.  Endocrine: Negative for cold intolerance and heat intolerance.  Genitourinary: Negative for decreased urine volume, dysuria and hematuria.  Musculoskeletal: Negative for gait problem and  myalgias.  Neurological: Negative for tremors, syncope, facial asymmetry and numbness.  Hematological: Negative for adenopathy. Does not bruise/bleed easily.  Rest see pertinent positives and negatives per HPI.  Current Outpatient Medications on File Prior to Visit  Medication Sig Dispense Refill   Ferrous Sulfate (IRON) 325 (65 Fe) MG TABS Take 1 tablet (325 mg total) by mouth daily. 30 tablet 0   Vitamin D, Ergocalciferol, (DRISDOL) 1.25 MG (50000 UNIT) CAPS capsule Take 1 capsule (50,000 Units total) by mouth every 7 (seven) days. 12 capsule 0   No current facility-administered medications on file prior to visit.   Past Medical History:  Diagnosis Date   Anxiety    Back pain    Depression    Heavy periods    Joint pain    Lower extremity edema    Menopause    PCOS (polycystic ovarian syndrome)    Shoulder pain    Vitamin B12 deficiency    Vitamin D deficiency    Allergies  Allergen Reactions   Latex Itching   Cephalexin Rash   Penicillin G Rash   Other    Social History   Socioeconomic History   Marital status: Married    Spouse name: Deborah Underwood    Number of children: 1   Years of education: Not on file   Highest education level: Not on file  Occupational History   Occupation: Risk analyst  Tobacco Use   Smoking status: Former Smoker    Packs/day: 1.50    Years: 17.00    Pack years: 25.50    Types: Cigarettes    Quit date: 05/16/2009  Years since quitting: 11.1   Smokeless tobacco: Never Used  Substance and Sexual Activity   Alcohol use: Yes   Drug use: Never   Sexual activity: Not Currently    Partners: Male  Other Topics Concern   Not on file  Social History Narrative   Not on file   Social Determinants of Health   Financial Resource Strain: Not on file  Food Insecurity: Not on file  Transportation Needs: Not on file  Physical Activity: Not on file  Stress: Not on file  Social Connections: Not on file   Vitals:    07/07/20 1207  BP: 130/80  Pulse: 81  Resp: 16  SpO2: 96%   Body mass index is 41.19 kg/m.  Physical Exam Vitals and nursing note reviewed.  Constitutional:      General: She is not in acute distress.    Appearance: She is well-developed.  HENT:     Head: Normocephalic and atraumatic.     Mouth/Throat:     Mouth: Oropharynx is clear and moist. Mucous membranes are dry.  Eyes:     Conjunctiva/sclera: Conjunctivae normal.     Pupils: Pupils are equal, round, and reactive to light.  Cardiovascular:     Rate and Rhythm: Normal rate and regular rhythm.     Pulses:          Dorsalis pedis pulses are 2+ on the right side and 2+ on the left side.     Heart sounds: No murmur heard.   Pulmonary:     Effort: Pulmonary effort is normal. No respiratory distress.     Breath sounds: Normal breath sounds.  Abdominal:     Palpations: Abdomen is soft. There is no hepatomegaly or mass.     Tenderness: There is no abdominal tenderness.  Musculoskeletal:        General: No edema.  Lymphadenopathy:     Cervical: No cervical adenopathy.  Skin:    General: Skin is warm.     Findings: No erythema or rash.  Neurological:     Mental Status: She is alert and oriented to person, place, and time.     Cranial Nerves: No cranial nerve deficit.     Gait: Gait normal.     Deep Tendon Reflexes: Strength normal.     Reflex Scores:      Bicep reflexes are 2+ on the right side and 2+ on the left side.      Patellar reflexes are 2+ on the right side and 2+ on the left side. Psychiatric:        Mood and Affect: Mood and affect normal.     Comments: Well groomed, good eye contact.   ASSESSMENT AND PLAN:  [name] was seen today for memory loss.  Diagnoses and all orders for this visit: Orders Placed This Encounter  Procedures   Vitamin B12   VITAMIN D 25 Hydroxy (Vit-D Deficiency, Fractures)   CBC   Hemoglobin A1c   TSH   Comprehensive metabolic panel   Lab Results  Component Value  Date   VITAMINB12 546 07/07/2020   Lab Results  Component Value Date   TSH 3.99 07/07/2020   Lab Results  Component Value Date   HGBA1C 5.5 07/07/2020   Lab Results  Component Value Date   CREATININE 0.83 07/07/2020   BUN 18 07/07/2020   NA 137 07/07/2020   K 4.3 07/07/2020   CL 103 07/07/2020   CO2 26 07/07/2020   Lab Results  Component Value  Date   ALT 13 07/07/2020   AST 12 07/07/2020   ALKPHOS 68 07/07/2020   BILITOT 0.3 07/07/2020   Memory difficulties We discussed possible causes. Reassured about dementia or other serious process. Neuro exam normal. We could consider neuropsychologic evaluation, she prefers to hold on this. Cognitive challenging exercises,healthful diet,and regular physical activity recommended. Further recommendations according to lab results.  Pre-diabetes Healthy life style for primary prevention encouraged.  Vitamin B 12 deficiency Further recommendations according to B12 result.  Vitamin D deficiency, unspecified Continue Ergocalciferol 50,000 U weekly. Further recommendations according to 25 OH vit D results.  Return if symptoms worsen or fail to improve.   Lesly Pontarelli G. [place], MD  [facility]. [place] office.    A few things to remember from today's visit:   Pre-diabetes - Plan: Hemoglobin A1c  Vitamin B 12 deficiency - Plan: Vitamin B12  Vitamin D deficiency, unspecified - Plan: Comprehensive metabolic panel  Memory difficulties - Plan: Vitamin B12, VITAMIN D 25 Hydroxy (Vit-D Deficiency, Fractures), CBC, TSH, Comprehensive metabolic panel  Please be sure medication list is accurate. If a new problem present, please set up appointment sooner than planned today.  Management of Memory Problems  There are some general things you can do to help manage your memory problems.  Your memory may not in fact recover, but by using techniques and strategies you will be able to manage your memory difficulties better.  1)   Establish a routine.  Try to establish and then stick to a regular routine.  By doing this, you will get used to what to expect and you will reduce the need to rely on your memory.  Also, try to do things at the same time of day, such as taking your medication or checking your calendar first thing in the morning.  Think about think that you can do as a part of a regular routine and make a list.  Then enter them into a daily planner to remind you.  This will help you establish a routine.  2)  Organize your environment.  Organize your environment so that it is uncluttered.  Decrease visual stimulation.  Place everyday items such as keys or cell phone in the same place every day (ie.  Basket next to front door)  Use post it notes with a brief message to yourself (ie. Turn off light, lock the door)  Use labels to indicate where things go (ie. Which cupboards are for food, dishes, etc.)  Keep a notepad and pen by the telephone to take messages  3)  Memory Aids  A diary or journal/notebook/daily planner  Making a list (shopping list, chore list, to do list that needs to be done)  Using an alarm as a reminder (kitchen timer or cell phone alarm)  Using cell phone to store information (Notes, Calendar, Reminders)  Calendar/White board placed in a prominent position  Post-it notes  In order for memory aids to be useful, you need to have good habits.  It's no good remembering to make a note in your journal if you don't remember to look in it.  Try setting aside a certain time of day to look in journal.  4)  Improving mood and managing fatigue.  There may be other factors that contribute to memory difficulties.  Factors, such as anxiety, depression and tiredness can affect memory.  Regular gentle exercise can help improve your mood and give you more energy.  Simple relaxation techniques may help relieve symptoms of  anxiety  Try to get back to completing activities or hobbies you enjoyed  doing in the past.  Learn to pace yourself through activities to decrease fatigue.  Find out about some local support groups where you can share experiences with others.  Try and achieve 7-8 hours of sleep at night.

## 2020-07-07 NOTE — Patient Instructions (Addendum)
A few things to remember from today's visit:   Pre-diabetes - Plan: Hemoglobin A1c  Vitamin B 12 deficiency - Plan: Vitamin B12  Vitamin D deficiency, unspecified - Plan: Comprehensive metabolic panel  Memory difficulties - Plan: Vitamin B12, VITAMIN D 25 Hydroxy (Vit-D Deficiency, Fractures), CBC, TSH, Comprehensive metabolic panel  Please be sure medication list is accurate. If a new problem present, please set up appointment sooner than planned today.  Management of Memory Problems  There are some general things you can do to help manage your memory problems.  Your memory may not in fact recover, but by using techniques and strategies you will be able to manage your memory difficulties better.  1)  Establish a routine.  Try to establish and then stick to a regular routine.  By doing this, you will get used to what to expect and you will reduce the need to rely on your memory.  Also, try to do things at the same time of day, such as taking your medication or checking your calendar first thing in the morning.  Think about think that you can do as a part of a regular routine and make a list.  Then enter them into a daily planner to remind you.  This will help you establish a routine.  2)  Organize your environment.  Organize your environment so that it is uncluttered.  Decrease visual stimulation.  Place everyday items such as keys or cell phone in the same place every day (ie.  Basket next to front door)  Use post it notes with a brief message to yourself (ie. Turn off light, lock the door)  Use labels to indicate where things go (ie. Which cupboards are for food, dishes, etc.)  Keep a notepad and pen by the telephone to take messages  3)  Memory Aids  A diary or journal/notebook/daily planner  Making a list (shopping list, chore list, to do list that needs to be done)  Using an alarm as a reminder (kitchen timer or cell phone alarm)  Using cell phone to store information  (Notes, Calendar, Reminders)  Calendar/White board placed in a prominent position  Post-it notes  In order for memory aids to be useful, you need to have good habits.  It's no good remembering to make a note in your journal if you don't remember to look in it.  Try setting aside a certain time of day to look in journal.  4)  Improving mood and managing fatigue.  There may be other factors that contribute to memory difficulties.  Factors, such as anxiety, depression and tiredness can affect memory.  Regular gentle exercise can help improve your mood and give you more energy.  Simple relaxation techniques may help relieve symptoms of anxiety  Try to get back to completing activities or hobbies you enjoyed doing in the past.  Learn to pace yourself through activities to decrease fatigue.  Find out about some local support groups where you can share experiences with others.  Try and achieve 7-8 hours of sleep at night.

## 2020-11-13 DIAGNOSIS — F4323 Adjustment disorder with mixed anxiety and depressed mood: Secondary | ICD-10-CM | POA: Diagnosis not present

## 2020-11-26 DIAGNOSIS — F4323 Adjustment disorder with mixed anxiety and depressed mood: Secondary | ICD-10-CM | POA: Diagnosis not present

## 2020-12-03 DIAGNOSIS — F4323 Adjustment disorder with mixed anxiety and depressed mood: Secondary | ICD-10-CM | POA: Diagnosis not present

## 2020-12-15 DIAGNOSIS — F4323 Adjustment disorder with mixed anxiety and depressed mood: Secondary | ICD-10-CM | POA: Diagnosis not present

## 2021-01-05 DIAGNOSIS — F4323 Adjustment disorder with mixed anxiety and depressed mood: Secondary | ICD-10-CM | POA: Diagnosis not present

## 2021-01-11 DIAGNOSIS — F4323 Adjustment disorder with mixed anxiety and depressed mood: Secondary | ICD-10-CM | POA: Diagnosis not present

## 2021-01-25 DIAGNOSIS — F4323 Adjustment disorder with mixed anxiety and depressed mood: Secondary | ICD-10-CM | POA: Diagnosis not present

## 2021-02-02 DIAGNOSIS — F4323 Adjustment disorder with mixed anxiety and depressed mood: Secondary | ICD-10-CM | POA: Diagnosis not present

## 2021-02-12 DIAGNOSIS — F4323 Adjustment disorder with mixed anxiety and depressed mood: Secondary | ICD-10-CM | POA: Diagnosis not present

## 2021-02-22 DIAGNOSIS — F4323 Adjustment disorder with mixed anxiety and depressed mood: Secondary | ICD-10-CM | POA: Diagnosis not present

## 2021-03-01 DIAGNOSIS — F4323 Adjustment disorder with mixed anxiety and depressed mood: Secondary | ICD-10-CM | POA: Diagnosis not present

## 2021-03-15 DIAGNOSIS — F4323 Adjustment disorder with mixed anxiety and depressed mood: Secondary | ICD-10-CM | POA: Diagnosis not present

## 2021-03-29 DIAGNOSIS — F4323 Adjustment disorder with mixed anxiety and depressed mood: Secondary | ICD-10-CM | POA: Diagnosis not present

## 2021-04-12 DIAGNOSIS — F4323 Adjustment disorder with mixed anxiety and depressed mood: Secondary | ICD-10-CM | POA: Diagnosis not present

## 2021-04-20 DIAGNOSIS — F4323 Adjustment disorder with mixed anxiety and depressed mood: Secondary | ICD-10-CM | POA: Diagnosis not present

## 2021-04-26 DIAGNOSIS — F4323 Adjustment disorder with mixed anxiety and depressed mood: Secondary | ICD-10-CM | POA: Diagnosis not present

## 2021-05-13 DIAGNOSIS — F4323 Adjustment disorder with mixed anxiety and depressed mood: Secondary | ICD-10-CM | POA: Diagnosis not present

## 2021-05-17 DIAGNOSIS — F4323 Adjustment disorder with mixed anxiety and depressed mood: Secondary | ICD-10-CM | POA: Diagnosis not present

## 2021-05-24 DIAGNOSIS — Z1151 Encounter for screening for human papillomavirus (HPV): Secondary | ICD-10-CM | POA: Diagnosis not present

## 2021-05-24 DIAGNOSIS — Z1231 Encounter for screening mammogram for malignant neoplasm of breast: Secondary | ICD-10-CM | POA: Diagnosis not present

## 2021-05-24 DIAGNOSIS — Z01419 Encounter for gynecological examination (general) (routine) without abnormal findings: Secondary | ICD-10-CM | POA: Diagnosis not present

## 2021-05-24 DIAGNOSIS — Z6841 Body Mass Index (BMI) 40.0 and over, adult: Secondary | ICD-10-CM | POA: Diagnosis not present

## 2021-05-24 DIAGNOSIS — Z124 Encounter for screening for malignant neoplasm of cervix: Secondary | ICD-10-CM | POA: Diagnosis not present

## 2021-05-25 DIAGNOSIS — Z124 Encounter for screening for malignant neoplasm of cervix: Secondary | ICD-10-CM | POA: Diagnosis not present

## 2021-05-25 DIAGNOSIS — Z1151 Encounter for screening for human papillomavirus (HPV): Secondary | ICD-10-CM | POA: Diagnosis not present

## 2021-05-27 DIAGNOSIS — F4323 Adjustment disorder with mixed anxiety and depressed mood: Secondary | ICD-10-CM | POA: Diagnosis not present

## 2021-06-17 DIAGNOSIS — F4323 Adjustment disorder with mixed anxiety and depressed mood: Secondary | ICD-10-CM | POA: Diagnosis not present

## 2021-06-19 ENCOUNTER — Other Ambulatory Visit: Payer: Self-pay | Admitting: Obstetrics and Gynecology

## 2021-06-19 DIAGNOSIS — R928 Other abnormal and inconclusive findings on diagnostic imaging of breast: Secondary | ICD-10-CM

## 2021-06-23 DIAGNOSIS — Z8041 Family history of malignant neoplasm of ovary: Secondary | ICD-10-CM | POA: Diagnosis not present

## 2021-06-23 DIAGNOSIS — Z13220 Encounter for screening for lipoid disorders: Secondary | ICD-10-CM | POA: Diagnosis not present

## 2021-06-23 DIAGNOSIS — E559 Vitamin D deficiency, unspecified: Secondary | ICD-10-CM | POA: Diagnosis not present

## 2021-06-23 DIAGNOSIS — Z130 Encounter for screening for diseases of the blood and blood-forming organs and certain disorders involving the immune mechanism: Secondary | ICD-10-CM | POA: Diagnosis not present

## 2021-06-23 DIAGNOSIS — Z13228 Encounter for screening for other metabolic disorders: Secondary | ICD-10-CM | POA: Diagnosis not present

## 2021-06-23 DIAGNOSIS — N926 Irregular menstruation, unspecified: Secondary | ICD-10-CM | POA: Diagnosis not present

## 2021-06-23 DIAGNOSIS — Z0289 Encounter for other administrative examinations: Secondary | ICD-10-CM

## 2021-06-28 DIAGNOSIS — F4323 Adjustment disorder with mixed anxiety and depressed mood: Secondary | ICD-10-CM | POA: Diagnosis not present

## 2021-07-12 DIAGNOSIS — F4323 Adjustment disorder with mixed anxiety and depressed mood: Secondary | ICD-10-CM | POA: Diagnosis not present

## 2021-07-14 ENCOUNTER — Other Ambulatory Visit: Payer: Self-pay

## 2021-07-14 ENCOUNTER — Encounter (INDEPENDENT_AMBULATORY_CARE_PROVIDER_SITE_OTHER): Payer: Self-pay | Admitting: Family Medicine

## 2021-07-14 ENCOUNTER — Ambulatory Visit (INDEPENDENT_AMBULATORY_CARE_PROVIDER_SITE_OTHER): Payer: BC Managed Care – PPO | Admitting: Family Medicine

## 2021-07-14 VITALS — BP 114/77 | HR 80 | Temp 98.1°F | Ht 67.0 in | Wt 263.0 lb

## 2021-07-14 DIAGNOSIS — E7849 Other hyperlipidemia: Secondary | ICD-10-CM | POA: Diagnosis not present

## 2021-07-14 DIAGNOSIS — Z9189 Other specified personal risk factors, not elsewhere classified: Secondary | ICD-10-CM

## 2021-07-14 DIAGNOSIS — E559 Vitamin D deficiency, unspecified: Secondary | ICD-10-CM

## 2021-07-14 DIAGNOSIS — R5383 Other fatigue: Secondary | ICD-10-CM | POA: Diagnosis not present

## 2021-07-14 DIAGNOSIS — E669 Obesity, unspecified: Secondary | ICD-10-CM

## 2021-07-14 DIAGNOSIS — E538 Deficiency of other specified B group vitamins: Secondary | ICD-10-CM

## 2021-07-14 DIAGNOSIS — R7303 Prediabetes: Secondary | ICD-10-CM | POA: Diagnosis not present

## 2021-07-14 DIAGNOSIS — R0602 Shortness of breath: Secondary | ICD-10-CM | POA: Diagnosis not present

## 2021-07-14 DIAGNOSIS — Z1331 Encounter for screening for depression: Secondary | ICD-10-CM

## 2021-07-14 DIAGNOSIS — Z6841 Body Mass Index (BMI) 40.0 and over, adult: Secondary | ICD-10-CM

## 2021-07-15 ENCOUNTER — Ambulatory Visit
Admission: RE | Admit: 2021-07-15 | Discharge: 2021-07-15 | Disposition: A | Discharge status: Home / Self Care | Payer: BC Managed Care – PPO | Source: Ambulatory Visit | Attending: Obstetrics and Gynecology | Admitting: Obstetrics and Gynecology

## 2021-07-15 ENCOUNTER — Ambulatory Visit: Payer: BC Managed Care – PPO

## 2021-07-15 DIAGNOSIS — R928 Other abnormal and inconclusive findings on diagnostic imaging of breast: Secondary | ICD-10-CM | POA: Diagnosis not present

## 2021-07-15 DIAGNOSIS — R922 Inconclusive mammogram: Secondary | ICD-10-CM | POA: Diagnosis not present

## 2021-07-15 LAB — CBC WITH DIFFERENTIAL/PLATELET
Basophils Absolute: 0.1 10*3/uL (ref 0.0–0.2)
Basos: 1 %
EOS (ABSOLUTE): 0.3 10*3/uL (ref 0.0–0.4)
Eos: 4 %
Hematocrit: 46.1 % (ref 34.0–46.6)
Hemoglobin: 15.3 g/dL (ref 11.1–15.9)
Immature Grans (Abs): 0 10*3/uL (ref 0.0–0.1)
Immature Granulocytes: 1 %
Lymphocytes Absolute: 1.9 10*3/uL (ref 0.7–3.1)
Lymphs: 28 %
MCH: 27.5 pg (ref 26.6–33.0)
MCHC: 33.2 g/dL (ref 31.5–35.7)
MCV: 83 fL (ref 79–97)
Monocytes Absolute: 0.4 10*3/uL (ref 0.1–0.9)
Monocytes: 7 %
Neutrophils Absolute: 4.1 10*3/uL (ref 1.4–7.0)
Neutrophils: 59 %
Platelets: 403 10*3/uL (ref 150–450)
RBC: 5.57 x10E6/uL — ABNORMAL HIGH (ref 3.77–5.28)
RDW: 13.8 % (ref 11.7–15.4)
WBC: 6.8 10*3/uL (ref 3.4–10.8)

## 2021-07-15 LAB — LIPID PANEL
Chol/HDL Ratio: 4.4 ratio (ref 0.0–4.4)
Cholesterol, Total: 209 mg/dL — ABNORMAL HIGH (ref 100–199)
HDL: 47 mg/dL (ref >39)
LDL Chol Calc (NIH): 140 mg/dL — ABNORMAL HIGH (ref 0–99)
Triglycerides: 123 mg/dL (ref 0–149)
VLDL Cholesterol Cal: 22 mg/dL (ref 5–40)

## 2021-07-15 LAB — COMPREHENSIVE METABOLIC PANEL
ALT: 38 IU/L — ABNORMAL HIGH (ref 0–32)
AST: 23 IU/L (ref 0–40)
Albumin/Globulin Ratio: 1.4 (ref 1.2–2.2)
Albumin: 4.1 g/dL (ref 3.8–4.8)
Alkaline Phosphatase: 88 IU/L (ref 44–121)
BUN/Creatinine Ratio: 11 (ref 9–23)
BUN: 10 mg/dL (ref 6–24)
Bilirubin Total: 0.2 mg/dL (ref 0.0–1.2)
CO2: 23 mmol/L (ref 20–29)
Calcium: 9.2 mg/dL (ref 8.7–10.2)
Chloride: 100 mmol/L (ref 96–106)
Creatinine, Ser: 0.87 mg/dL (ref 0.57–1.00)
Globulin, Total: 2.9 g/dL (ref 1.5–4.5)
Glucose: 100 mg/dL — ABNORMAL HIGH (ref 70–99)
Potassium: 4.7 mmol/L (ref 3.5–5.2)
Sodium: 137 mmol/L (ref 134–144)
Total Protein: 7 g/dL (ref 6.0–8.5)
eGFR: 81 mL/min/{1.73_m2} (ref >59)

## 2021-07-15 LAB — IRON AND TIBC
Iron Saturation: 13 % — ABNORMAL LOW (ref 15–55)
Iron: 47 ug/dL (ref 27–159)
Total Iron Binding Capacity: 365 ug/dL (ref 250–450)
UIBC: 318 ug/dL (ref 131–425)

## 2021-07-15 LAB — FOLATE: Folate: 5.9 ng/mL (ref >3.0)

## 2021-07-15 LAB — T4, FREE: Free T4: 0.97 ng/dL (ref 0.82–1.77)

## 2021-07-15 LAB — INSULIN, RANDOM: INSULIN: 20.5 u[IU]/mL (ref 2.6–24.9)

## 2021-07-15 LAB — VITAMIN D 25 HYDROXY (VIT D DEFICIENCY, FRACTURES): Vit D, 25-Hydroxy: 35.1 ng/mL (ref 30.0–100.0)

## 2021-07-15 LAB — T3, FREE: T3, Free: 3.1 pg/mL (ref 2.0–4.4)

## 2021-07-15 LAB — HEMOGLOBIN A1C
Est. average glucose Bld gHb Est-mCnc: 123 mg/dL
Hgb A1c MFr Bld: 5.9 % — ABNORMAL HIGH (ref 4.8–5.6)

## 2021-07-15 LAB — TSH: TSH: 3.08 u[IU]/mL (ref 0.450–4.500)

## 2021-07-15 LAB — VITAMIN B12: Vitamin B-12: 851 pg/mL (ref 232–1245)

## 2021-07-15 LAB — FERRITIN: Ferritin: 61 ng/mL (ref 15–150)

## 2021-07-15 NOTE — Progress Notes (Signed)
Chief Complaint:   OBESITY Deborah Underwood (MR# [id number]) is a 51 y.o. female who presents for evaluation and treatment of obesity and related comorbidities. Current BMI is Body mass index is 41.19 kg/m. [name] has been struggling with her weight for many years and has been unsuccessful in either losing weight, maintaining weight loss, or reaching her healthy weight goal.  [name] is currently in the action stage of change and ready to dedicate time achieving and maintaining a healthier weight. [name] is interested in becoming our patient and working on intensive lifestyle modifications including (but not limited to) diet and exercise for weight loss.  [name]'s habits were reviewed today and are as follows: Her family eats meals together, she thinks her family will eat healthier with her, her desired weight loss is 123 lbs, she has been heavy most of her life, she started gaining weight as a teenager, her heaviest weight ever was 275 pounds, she has significant food cravings issues, she snacks frequently in the evenings, she skips meals frequently, she is frequently drinking liquids with calories, she frequently makes poor food choices, she frequently eats larger portions than normal, and she struggles with emotional eating.  Depression Screen [name]'s Food and Mood (modified PHQ-9) score was 9.  Depression screen PHQ 2/9 07/14/2021  Decreased Interest 1  Down, Depressed, Hopeless 1  PHQ - 2 Score 2  Altered sleeping 0  Tired, decreased energy 2  Change in appetite 2  Feeling bad or failure about yourself  2  Trouble concentrating 1  Moving slowly or fidgety/restless 0  Suicidal thoughts 0  PHQ-9 Score 9  Difficult doing work/chores -   Subjective:   1. Other fatigue Deborah Underwood admits to daytime somnolence and admits to waking up still tired. Patient has a history of symptoms of daytime fatigue and morning fatigue. Deborah Underwood generally gets 7 hours of sleep per night, and states that she has  generally restful sleep. Snoring is present. Apneic episodes are not present. Epworth Sleepiness Score is 5.   2. SOB (shortness of breath) on exertion Deborah Underwood notes increasing shortness of breath with exercising and seems to be worsening over time with weight gain. She notes getting out of breath sooner with activity than she used to. This has not gotten worse recently. [name] denies shortness of breath at rest or orthopnea.  3. Pre-diabetes Deborah Underwood has a history of elevated HgA1c and insulin. She notes polyphagia, and she has been on metformin in the past but not recently.  4. Vitamin D deficiency Laneah is not on Vitamin D currently. Her level has been low in the past and she notes fatigue.  5. B12 deficiency Deborah Underwood has a history of B12 deficiency. She has no recent labs, and she notes fatigue.  6. Other hyperlipidemia Deborah Underwood is not on statin, and she is working on diet and weight loss. She is due to have labs.   7. At risk for heart disease Deborah Underwood is at higher than average risk for cardiovascular disease due to obesity.  Assessment/Plan:   1. Other fatigue Deborah Underwood does feel that her weight is causing her energy to be lower than it should be. Fatigue may be related to obesity, depression or many other causes. Labs will be ordered, and in the meanwhile, Deborah Underwood will focus on self care including making healthy food choices, increasing physical activity and focusing on stress reduction.  - CBC with Differential/Platelet - Comprehensive metabolic panel - Folate - T3, free - T4, free - TSH -  Folate - Iron and TIBC - Ferritin  2. SOB (shortness of breath) on exertion Deborah Underwood does feel that she gets out of breath more easily that she used to when she exercises. Deborah Underwood's shortness of breath appears to be obesity related and exercise induced. She has agreed to work on weight loss and gradually increase exercise to treat her exercise induced shortness of breath. Will continue to monitor closely.  3.  Pre-diabetes We will check labs today. Offie will start her eating plan, and will work on weight loss, exercise, and decreasing simple carbohydrates to help decrease the risk of diabetes.   - Hemoglobin A1c - Insulin, random  4. Vitamin D deficiency Low Vitamin D level contributes to fatigue and are associated with obesity, breast, and colon cancer. We will check labs today. Deborah Underwood will follow-up for routine testing of Vitamin D, at least 2-3 times per year to avoid over-replacement.  - VITAMIN D 25 Hydroxy (Vit-D Deficiency, Fractures)  5. B12 deficiency The diagnosis was reviewed with the patient. We will check labs today and will follow up at [name]'s next visit. Orders and follow up as documented in patient record.  - Vitamin B12  6. Other hyperlipidemia Cardiovascular risk and specific lipid/LDL goals reviewed. We will check labs today. Deborah Underwood will start her eating plan, and will work exercise and weight loss efforts. Orders and follow up as documented in patient record.   - Lipid panel  7. Screening for depression Deborah Underwood had a positive depression screening. Depression is commonly associated with obesity and often results in emotional eating behaviors. We will monitor this closely and work on CBT to help improve the non-hunger eating patterns. Referral to Psychology may be required if no improvement is seen as she continues in our clinic.  8. At risk for heart disease Deborah Underwood was given approximately 30 minutes of coronary artery disease prevention counseling today. She is 51 y.o. female and has risk factors for heart disease including obesity. We discussed intensive lifestyle modifications today with an emphasis on specific weight loss instructions and strategies.  Repetitive spaced learning was employed today to elicit superior memory formation and behavioral change.   9. Obesity with current BMI of 41.3 Deborah Underwood is currently in the action stage of change and her goal is to continue with weight  loss efforts. I recommend Deborah Underwood begin the structured treatment plan as follows:  She has agreed to keeping a food journal and adhering to recommended goals of 1300-1500 calories and 90+ grams of protein daily.  Exercise goals: No exercise has been prescribed for now, while we concentrate on nutritional changes.  Behavioral modification strategies: increasing lean protein intake.  She was informed of the importance of frequent follow-up visits to maximize her success with intensive lifestyle modifications for her multiple health conditions. She was informed we would discuss her lab results at her next visit unless there is a critical issue that needs to be addressed sooner. Mayeli agreed to keep her next visit at the agreed upon time to discuss these results.  Objective:   Blood pressure 114/77, pulse 80, temperature 98.1 F (36.7 C), height 5\' 7"  (1.702 m), weight 263 lb (119.3 kg), last menstrual period 06/19/2021, SpO2 99 %. Body mass index is 41.19 kg/m.  EKG: Normal sinus rhythm, rate (unable to obtain).  Indirect Calorimeter completed today shows a VO2 of 271 and a REE of 1872.  Her calculated basal metabolic rate is 7622 thus her basal metabolic rate is worse than expected.  General: Cooperative, alert,  well developed, in no acute distress. HEENT: Conjunctivae and lids unremarkable. Cardiovascular: Regular rhythm.  Lungs: Normal work of breathing. Neurologic: No focal deficits.   Lab Results  Component Value Date   CREATININE 0.83 07/07/2020   BUN 18 07/07/2020   NA 137 07/07/2020   K 4.3 07/07/2020   CL 103 07/07/2020   CO2 26 07/07/2020   Lab Results  Component Value Date   ALT 13 07/07/2020   AST 12 07/07/2020   ALKPHOS 68 07/07/2020   BILITOT 0.3 07/07/2020   Lab Results  Component Value Date   HGBA1C 5.5 07/07/2020   HGBA1C 5.7 (H) 03/30/2020   HGBA1C 5.8 (H) 11/28/2019   HGBA1C 5.6 07/19/2019   Lab Results  Component Value Date   INSULIN 12.9 03/30/2020    INSULIN 10.0 11/28/2019   Lab Results  Component Value Date   TSH 3.99 07/07/2020   Lab Results  Component Value Date   CHOL 218 (H) 03/30/2020   HDL 57 03/30/2020   LDLCALC 142 (H) 03/30/2020   TRIG 109 03/30/2020   Lab Results  Component Value Date   WBC 8.7 07/07/2020   HGB 13.9 07/07/2020   HCT 41.6 07/07/2020   MCV 78.3 07/07/2020   PLT 351.0 07/07/2020   Lab Results  Component Value Date   IRON 27 03/30/2020   TIBC 429 03/30/2020   FERRITIN 7 (L) 03/30/2020   Attestation Statements:   Reviewed by clinician on day of visit: allergies, medications, problem list, medical history, surgical history, family history, social history, and previous encounter notes.   I, Burt Knack, am acting as transcriptionist for Quillian Quince, MD.  I have reviewed the above documentation for accuracy and completeness, and I agree with the above. - Quillian Quince, MD

## 2021-07-26 ENCOUNTER — Other Ambulatory Visit: Payer: Self-pay

## 2021-07-26 ENCOUNTER — Ambulatory Visit (INDEPENDENT_AMBULATORY_CARE_PROVIDER_SITE_OTHER): Payer: BC Managed Care – PPO | Admitting: Family Medicine

## 2021-07-26 ENCOUNTER — Encounter (INDEPENDENT_AMBULATORY_CARE_PROVIDER_SITE_OTHER): Payer: Self-pay | Admitting: Family Medicine

## 2021-07-26 VITALS — BP 110/72 | HR 84 | Temp 98.1°F | Ht 67.0 in | Wt 264.0 lb

## 2021-07-26 DIAGNOSIS — R7303 Prediabetes: Secondary | ICD-10-CM | POA: Diagnosis not present

## 2021-07-26 DIAGNOSIS — Z9189 Other specified personal risk factors, not elsewhere classified: Secondary | ICD-10-CM

## 2021-07-26 DIAGNOSIS — E559 Vitamin D deficiency, unspecified: Secondary | ICD-10-CM

## 2021-07-26 DIAGNOSIS — F3289 Other specified depressive episodes: Secondary | ICD-10-CM

## 2021-07-26 DIAGNOSIS — E669 Obesity, unspecified: Secondary | ICD-10-CM | POA: Diagnosis not present

## 2021-07-26 MED ORDER — BUPROPION HCL ER (SR) 150 MG PO TB12
150.0000 mg | ORAL_TABLET | Freq: Every day | ORAL | 0 refills | Status: DC
Start: 2021-07-26 — End: 2021-09-07

## 2021-07-26 MED ORDER — VITAMIN D (ERGOCALCIFEROL) 1.25 MG (50000 UNIT) PO CAPS: 50000.0000 [IU] | ORAL_CAPSULE | ORAL | 0 refills | Status: DC

## 2021-07-27 NOTE — Progress Notes (Signed)
? ? ? ?Chief Complaint:  ? ?OBESITY ?Deborah Underwood is here to discuss her progress with her obesity treatment plan along with follow-up of her obesity related diagnoses. Deborah Underwood is on keeping a food journal and adhering to recommended goals of 1300-1500 calories and 90+ grams of protein daily and states she is following her eating plan approximately 50% of the time. Deborah Underwood states she is doing 0 minutes 0 times per week. ? ?Today's visit was #: 2 ?Starting weight: 263 lbs ?Starting date: 07/14/2021 ?Today's weight: 264 lbs ?Today's date: 07/26/2021 ?Total lbs lost to date: 0 ?Total lbs lost since last in-office visit: 0 ? ?Interim History: Deborah Underwood states she was able to journal approximately 70% of the time. She struggled to meet her protein goals at times, and she noticed increased PM cravings. ? ?Subjective:  ? ?1. Vitamin D deficiency ?Deborah Underwood's Vit D level is below goal. She has been on Vitamin D in the past. I discussed labs with the patient today. ? ?2. Pre-diabetes ?Deborah Underwood was on metformin in the past, but she did not tolerate it. ? ?3. Other depression, with emotional eating ?Deborah Underwood has a new diagnosis of emotional eating behaviors. She notes struggling with PM cravings and she is open to looking at options to help her.  ? ?4. At risk for diabetes mellitus ?Deborah Underwood is at higher than average risk for developing diabetes due to her obesity. ? ?Assessment/Plan:  ? ?1. Vitamin D deficiency ?Deborah Underwood agreed to restart prescription Vitamin D 50,000 IU once weekly with no refills.  ? ?- Vitamin D, Ergocalciferol, (DRISDOL) 1.25 MG (50000 UNIT) CAPS capsule; Take 1 capsule (50,000 Units total) by mouth every 7 (seven) days.  Dispense: 4 capsule; Refill: 0 ? ?2. Pre-diabetes ?Deborah Underwood will continue with her diet, exercise, and weight loss.  ? ?3. Other depression, with emotional eating ?Deborah Underwood agreed to start Wellbutrin SR 150 mg q AM with no refills. She was educated on causes of emotional eating behaviors and emotional eating strategies as well.  ? ?-  buPROPion (WELLBUTRIN SR) 150 MG 12 hr tablet; Take 1 tablet (150 mg total) by mouth daily. Take in the AM.  Dispense: 30 tablet; Refill: 0 ? ?4. At risk for diabetes mellitus ?Deborah Underwood was given approximately 30 minutes of diabetic education and counseling today. We discussed intensive lifestyle modifications today with an emphasis on weight loss as well as increasing exercise and decreasing simple carbohydrates in her diet. We also reviewed medication options with an emphasis on risk versus benefits of those discussed. ? ?Repetitive spaced learning was employed today to elicit superior memory formation and behavioral change. ? ?5. Obesity with current BMI of 41.3 ?Deborah Underwood is currently in the action stage of change. As such, her goal is to continue with weight loss efforts. She has agreed to keeping a food journal and adhering to recommended goals of 1300-1500 calories and 90+ grams of protein daily.  ? ?Smart fruit handout was given. ? ?Behavioral modification strategies: increasing lean protein intake and emotional eating strategies. ? ?Deborah Underwood has agreed to follow-up with our clinic in 2 weeks. She was informed of the importance of frequent follow-up visits to maximize her success with intensive lifestyle modifications for her multiple health conditions.  ? ?Objective:  ? ?Blood pressure 110/72, pulse 84, temperature 98.1 ?F (36.7 ?C), height 5\' 7"  (1.702 m), weight 264 lb (119.7 kg), SpO2 98 %. ?Body mass index is 41.35 kg/m?. ? ?General: Cooperative, alert, well developed, in no acute distress. ?HEENT: Conjunctivae and lids unremarkable. ?Cardiovascular: Regular  rhythm.  ?Lungs: Normal work of breathing. ?Neurologic: No focal deficits.  ? ?Lab Results  ?Component Value Date  ? CREATININE 0.87 07/14/2021  ? BUN 10 07/14/2021  ? NA 137 07/14/2021  ? K 4.7 07/14/2021  ? CL 100 07/14/2021  ? CO2 23 07/14/2021  ? ?Lab Results  ?Component Value Date  ? ALT 38 (H) 07/14/2021  ? AST 23 07/14/2021  ? ALKPHOS 88 07/14/2021  ?  BILITOT 0.2 07/14/2021  ? ?Lab Results  ?Component Value Date  ? HGBA1C 5.9 (H) 07/14/2021  ? HGBA1C 5.5 07/07/2020  ? HGBA1C 5.7 (H) 03/30/2020  ? HGBA1C 5.8 (H) 11/28/2019  ? HGBA1C 5.6 07/19/2019  ? ?Lab Results  ?Component Value Date  ? INSULIN 20.5 07/14/2021  ? INSULIN 12.9 03/30/2020  ? INSULIN 10.0 11/28/2019  ? ?Lab Results  ?Component Value Date  ? TSH 3.080 07/14/2021  ? ?Lab Results  ?Component Value Date  ? CHOL 209 (H) 07/14/2021  ? HDL 47 07/14/2021  ? LDLCALC 140 (H) 07/14/2021  ? TRIG 123 07/14/2021  ? CHOLHDL 4.4 07/14/2021  ? ?Lab Results  ?Component Value Date  ? VD25OH 35.1 07/14/2021  ? VD25OH 34.73 07/07/2020  ? VD25OH 46.6 03/30/2020  ? ?Lab Results  ?Component Value Date  ? WBC 6.8 07/14/2021  ? HGB 15.3 07/14/2021  ? HCT 46.1 07/14/2021  ? MCV 83 07/14/2021  ? PLT 403 07/14/2021  ? ?Lab Results  ?Component Value Date  ? IRON 47 07/14/2021  ? TIBC 365 07/14/2021  ? FERRITIN 61 07/14/2021  ? ?Attestation Statements:  ? ?Reviewed by clinician on day of visit: allergies, medications, problem list, medical history, surgical history, family history, social history, and previous encounter notes. ? ? ?I, Trixie Dredge, am acting as transcriptionist for Dennard Nip, MD. ? ?I have reviewed the above documentation for accuracy and completeness, and I agree with the above. -  Dennard Nip, MD ? ? ?

## 2021-08-09 ENCOUNTER — Ambulatory Visit (INDEPENDENT_AMBULATORY_CARE_PROVIDER_SITE_OTHER): Payer: BC Managed Care – PPO | Admitting: Family Medicine

## 2021-08-09 ENCOUNTER — Encounter (INDEPENDENT_AMBULATORY_CARE_PROVIDER_SITE_OTHER): Payer: Self-pay | Admitting: Family Medicine

## 2021-08-09 ENCOUNTER — Other Ambulatory Visit: Payer: Self-pay

## 2021-08-09 VITALS — BP 112/69 | HR 84 | Temp 98.3°F | Ht 67.0 in | Wt 265.0 lb

## 2021-08-09 DIAGNOSIS — E669 Obesity, unspecified: Secondary | ICD-10-CM | POA: Diagnosis not present

## 2021-08-09 DIAGNOSIS — Z9189 Other specified personal risk factors, not elsewhere classified: Secondary | ICD-10-CM

## 2021-08-09 DIAGNOSIS — F3289 Other specified depressive episodes: Secondary | ICD-10-CM

## 2021-08-09 DIAGNOSIS — E559 Vitamin D deficiency, unspecified: Secondary | ICD-10-CM | POA: Diagnosis not present

## 2021-08-09 DIAGNOSIS — E66813 Obesity, class 3: Secondary | ICD-10-CM

## 2021-08-09 DIAGNOSIS — Z6841 Body Mass Index (BMI) 40.0 and over, adult: Secondary | ICD-10-CM | POA: Diagnosis not present

## 2021-08-09 MED ORDER — VITAMIN D (ERGOCALCIFEROL) 1.25 MG (50000 UNIT) PO CAPS: 50000.0000 [IU] | ORAL_CAPSULE | ORAL | 0 refills | Status: DC

## 2021-08-10 DIAGNOSIS — L409 Psoriasis, unspecified: Secondary | ICD-10-CM | POA: Diagnosis not present

## 2021-08-10 DIAGNOSIS — L408 Other psoriasis: Secondary | ICD-10-CM | POA: Diagnosis not present

## 2021-08-10 DIAGNOSIS — D2362 Other benign neoplasm of skin of left upper limb, including shoulder: Secondary | ICD-10-CM | POA: Diagnosis not present

## 2021-08-11 NOTE — Progress Notes (Signed)
? ? ? ?Chief Complaint:  ? ?OBESITY ?Deborah Underwood is here to discuss her progress with her obesity treatment plan along with follow-up of her obesity related diagnoses. Deborah Underwood is on keeping a food journal and adhering to recommended goals of 1300-1500 calories and 90+ grams of protein daily and states she is following her eating plan approximately 70% of the time. Deborah Underwood states she is doing 0 minutes 0 times per week. ? ?Today's visit was #: 3 ?Starting weight: 263 lbs ?Starting date: 07/14/2021 ?Today's weight: 265 lbs ?Today's date: 08/09/2021 ?Total lbs lost to date: 0 ?Total lbs lost since last in-office visit: 0 ? ?Interim History: Deborah Underwood was changed to the journaling plan at her last visit. She is struggling with this plan however she notes increased stress and this has increased sugar cravings. She feels she is doing well with meal planning. ? ?Subjective:  ? ?1. Vitamin D deficiency ?Deborah Underwood is stable on Vit D, but her level is not yet at goal. She denies side effects. ? ?2. Other depression, with emotional eating ?Deborah Underwood has questions about Wellbutrin after looking at some side effects, specifically if she can drive while on this.  ? ?3. At risk for heart disease ?Deborah Underwood is at higher than average risk for cardiovascular disease due to obesity. ? ?Assessment/Plan:  ? ?1. Vitamin D deficiency ?We will refill prescription Vitamin D for 90 days with no refill. Shakita will follow-up for routine testing of Vitamin D, at least 2-3 times per year to avoid over-replacement. ? ?- Vitamin D, Ergocalciferol, (DRISDOL) 1.25 MG (50000 UNIT) CAPS capsule; Take 1 capsule (50,000 Units total) by mouth every 7 (seven) days.  Dispense: 12 capsule; Refill: 0 ? ?2. Other depression, with emotional eating ?I went over the likely side effects and she is ok to drive. We will recheck her blood pressure at her next visit and continue to follow. Emotional eating strategies were discussed.  ? ?3. At risk for heart disease ?Deborah Underwood was given approximately 15  minutes of coronary artery disease prevention counseling today. She is 51 y.o. female and has risk factors for heart disease including obesity. We discussed intensive lifestyle modifications today with an emphasis on specific weight loss instructions and strategies. ? ?Repetitive spaced learning was employed today to elicit superior memory formation and behavioral change.  ? ?4. Obesity with current BMI of 41.5 ?Deborah Underwood is currently in the action stage of change. As such, her goal is to continue with weight loss efforts. She has agreed to keeping a food journal and adhering to recommended goals of 1300-1500 calories and 90+ grams of protein daily.  ? ?Behavioral modification strategies: increasing lean protein intake. ? ?Deborah Underwood has agreed to follow-up with our clinic in 2 weeks. She was informed of the importance of frequent follow-up visits to maximize her success with intensive lifestyle modifications for her multiple health conditions.  ? ?Objective:  ? ?Blood pressure 112/69, pulse 84, temperature 98.3 ?F (36.8 ?C), height 5\' 7"  (1.702 m), weight 265 lb (120.2 kg), SpO2 98 %. ?Body mass index is 41.5 kg/m?. ? ?General: Cooperative, alert, well developed, in no acute distress. ?HEENT: Conjunctivae and lids unremarkable. ?Cardiovascular: Regular rhythm.  ?Lungs: Normal work of breathing. ?Neurologic: No focal deficits.  ? ?Lab Results  ?Component Value Date  ? CREATININE 0.87 07/14/2021  ? BUN 10 07/14/2021  ? NA 137 07/14/2021  ? K 4.7 07/14/2021  ? CL 100 07/14/2021  ? CO2 23 07/14/2021  ? ?Lab Results  ?Component Value Date  ? ALT  38 (H) 07/14/2021  ? AST 23 07/14/2021  ? ALKPHOS 88 07/14/2021  ? BILITOT 0.2 07/14/2021  ? ?Lab Results  ?Component Value Date  ? HGBA1C 5.9 (H) 07/14/2021  ? HGBA1C 5.5 07/07/2020  ? HGBA1C 5.7 (H) 03/30/2020  ? HGBA1C 5.8 (H) 11/28/2019  ? HGBA1C 5.6 07/19/2019  ? ?Lab Results  ?Component Value Date  ? INSULIN 20.5 07/14/2021  ? INSULIN 12.9 03/30/2020  ? INSULIN 10.0 11/28/2019   ? ?Lab Results  ?Component Value Date  ? TSH 3.080 07/14/2021  ? ?Lab Results  ?Component Value Date  ? CHOL 209 (H) 07/14/2021  ? HDL 47 07/14/2021  ? LDLCALC 140 (H) 07/14/2021  ? TRIG 123 07/14/2021  ? CHOLHDL 4.4 07/14/2021  ? ?Lab Results  ?Component Value Date  ? VD25OH 35.1 07/14/2021  ? VD25OH 34.73 07/07/2020  ? VD25OH 46.6 03/30/2020  ? ?Lab Results  ?Component Value Date  ? WBC 6.8 07/14/2021  ? HGB 15.3 07/14/2021  ? HCT 46.1 07/14/2021  ? MCV 83 07/14/2021  ? PLT 403 07/14/2021  ? ?Lab Results  ?Component Value Date  ? IRON 47 07/14/2021  ? TIBC 365 07/14/2021  ? FERRITIN 61 07/14/2021  ? ?Attestation Statements:  ? ?Reviewed by clinician on day of visit: allergies, medications, problem list, medical history, surgical history, family history, social history, and previous encounter notes. ? ? ?I, Burt Knack, am acting as transcriptionist for Quillian Quince, MD. ? ?I have reviewed the above documentation for accuracy and completeness, and I agree with the above. -  Quillian Quince, MD ? ? ?

## 2021-08-17 ENCOUNTER — Other Ambulatory Visit (INDEPENDENT_AMBULATORY_CARE_PROVIDER_SITE_OTHER): Payer: Self-pay | Admitting: Family Medicine

## 2021-08-17 DIAGNOSIS — F3289 Other specified depressive episodes: Secondary | ICD-10-CM

## 2021-08-20 ENCOUNTER — Encounter (INDEPENDENT_AMBULATORY_CARE_PROVIDER_SITE_OTHER): Payer: Self-pay | Admitting: Family Medicine

## 2021-08-24 ENCOUNTER — Ambulatory Visit (INDEPENDENT_AMBULATORY_CARE_PROVIDER_SITE_OTHER): Payer: BC Managed Care – PPO | Admitting: Family Medicine

## 2021-09-07 ENCOUNTER — Encounter (INDEPENDENT_AMBULATORY_CARE_PROVIDER_SITE_OTHER): Payer: Self-pay | Admitting: Family Medicine

## 2021-09-07 ENCOUNTER — Ambulatory Visit (INDEPENDENT_AMBULATORY_CARE_PROVIDER_SITE_OTHER): Payer: BC Managed Care – PPO | Admitting: Family Medicine

## 2021-09-07 VITALS — BP 108/74 | HR 92 | Temp 98.2°F | Ht 67.0 in | Wt 267.0 lb

## 2021-09-07 DIAGNOSIS — F3289 Other specified depressive episodes: Secondary | ICD-10-CM

## 2021-09-07 DIAGNOSIS — E559 Vitamin D deficiency, unspecified: Secondary | ICD-10-CM

## 2021-09-07 DIAGNOSIS — Z6841 Body Mass Index (BMI) 40.0 and over, adult: Secondary | ICD-10-CM

## 2021-09-07 DIAGNOSIS — R7989 Other specified abnormal findings of blood chemistry: Secondary | ICD-10-CM

## 2021-09-07 DIAGNOSIS — Z9189 Other specified personal risk factors, not elsewhere classified: Secondary | ICD-10-CM

## 2021-09-07 DIAGNOSIS — E66813 Obesity, class 3: Secondary | ICD-10-CM

## 2021-09-07 DIAGNOSIS — E669 Obesity, unspecified: Secondary | ICD-10-CM

## 2021-09-08 MED ORDER — VITAMIN D (ERGOCALCIFEROL) 1.25 MG (50000 UNIT) PO CAPS: 50000.0000 [IU] | ORAL_CAPSULE | ORAL | 0 refills | Status: DC

## 2021-09-08 MED ORDER — BUPROPION HCL ER (SR) 150 MG PO TB12: 150.0000 mg | ORAL_TABLET | Freq: Every day | ORAL | 0 refills | Status: DC

## 2021-09-09 DIAGNOSIS — L309 Dermatitis, unspecified: Secondary | ICD-10-CM | POA: Diagnosis not present

## 2021-09-09 DIAGNOSIS — D485 Neoplasm of uncertain behavior of skin: Secondary | ICD-10-CM | POA: Diagnosis not present

## 2021-09-09 DIAGNOSIS — D2362 Other benign neoplasm of skin of left upper limb, including shoulder: Secondary | ICD-10-CM | POA: Diagnosis not present

## 2021-09-09 DIAGNOSIS — L814 Other melanin hyperpigmentation: Secondary | ICD-10-CM | POA: Diagnosis not present

## 2021-09-09 DIAGNOSIS — L409 Psoriasis, unspecified: Secondary | ICD-10-CM | POA: Diagnosis not present

## 2021-09-20 NOTE — Progress Notes (Signed)
? ? ? ?Chief Complaint:  ? ?OBESITY ?Deborah Underwood is here to discuss her progress with her obesity treatment plan along with follow-up of her obesity related diagnoses. Deborah Underwood is on keeping a food journal and adhering to recommended goals of 1300-1500 calories and 90+ grams of protein daily and states she is following her eating plan approximately 50% of the time. Deborah Underwood states she is walking 2 times per week. ? ?Today's visit was #: 4 ?Starting weight: 263 lbs ?Starting date: 07/14/2021 ?Today's weight: 267 lbs ?Today's date: 09/07/2021 ?Total lbs lost to date: 0 ?Total lbs lost since last in-office visit: 0 ? ?Interim History: Deborah Underwood has been journaling and working on meeting her calories and protein goals. She has struggled more in the last week or so, and she has challenges with meal planning especially in the evenings.  ? ?Subjective:  ? ?1. Elevated LFTs ?Deborah Underwood has question and concerns about liver function, and risks of liver cirrhosis and cancer. No severe abdominal pain or jaundice were noted.  ? ?2. Vitamin D deficiency ?Deborah Underwood is stable on Vitamin D, but her level is not yet at goal. No side effects were noted. ? ?3. Other depression, with emotional eating ?Deborah Underwood started Wellbutrin and noted an opposite reaction of feeling tired and foggy head.  ? ?4. At risk for impaired function of liver ?Deborah Underwood is at increased risk for impaired liver function due to elevated LFTs. ? ?Assessment/Plan:  ? ?1. Elevated LFTs ?I explained to the patient the multiple causes of elevated LFTs and statistical chances that her mildly elevated ALT was a sign of something more serious than non-alcoholic fatty liver disease. Deborah Underwood is to continue her weight loss efforts and we will recheck labs in 2 months to reassess. ? ?2. Vitamin D deficiency ?We will refill prescription Vitamin D for 1 month. Deborah Underwood will follow-up for routine testing of Vitamin D, at least 2-3 times per year to avoid over-replacement. ? ?- Vitamin D, Ergocalciferol, (DRISDOL) 1.25 MG  (50000 UNIT) CAPS capsule; Take 1 capsule (50,000 Units total) by mouth every 7 (seven) days.  Dispense: 4 capsule; Refill: 0 ? ?3. Other depression, with emotional eating ?We will refill Wellbutrin SR for 1 month. Deborah Underwood is to change her dose to PM, and we will follow up at her next visit. ? ?- buPROPion (WELLBUTRIN SR) 150 MG 12 hr tablet; Take 1 tablet (150 mg total) by mouth daily. Take in the AM.  Dispense: 30 tablet; Refill: 0 ? ?4. At risk for impaired function of liver ?Deborah Underwood was given approximately 15 minutes of counseling today regarding prevention of impaired liver function. Deborah Underwood was educated about her risk of developing NASH or even liver failure and advised that the only proven treatment for NAFLD was weight loss of at least 5-10% of body weight.  ? ?5. Obesity with current BMI of 41.8 ?Deborah Underwood is currently in the action stage of change. As such, her goal is to continue with weight loss efforts. She has agreed to keeping a food journal and adhering to recommended goals of 1300-1500 calories and 90 grams of protein daily.  ? ?Strategies to make meal planning/prepping easier were discussed, and easy protein rich recipes were given. ? ?Exercise goals: All adults should avoid inactivity. Some physical activity is better than none, and adults who participate in any amount of physical activity gain some health benefits. ? ?Behavioral modification strategies: increasing lean protein intake and meal planning and cooking strategies. ? ?Deborah Underwood has agreed to follow-up with our clinic in 2  to 3 weeks. She was informed of the importance of frequent follow-up visits to maximize her success with intensive lifestyle modifications for her multiple health conditions.  ? ?Objective:  ? ?Blood pressure 108/74, pulse 92, temperature 98.2 ?F (36.8 ?C), height 5\' 7"  (1.702 m), weight 267 lb (121.1 kg), SpO2 97 %. ?Body mass index is 41.82 kg/m?. ? ?General: Cooperative, alert, well developed, in no acute distress. ?HEENT:  Conjunctivae and lids unremarkable. ?Cardiovascular: Regular rhythm.  ?Lungs: Normal work of breathing. ?Neurologic: No focal deficits.  ? ?Lab Results  ?Component Value Date  ? CREATININE 0.87 07/14/2021  ? BUN 10 07/14/2021  ? NA 137 07/14/2021  ? K 4.7 07/14/2021  ? CL 100 07/14/2021  ? CO2 23 07/14/2021  ? ?Lab Results  ?Component Value Date  ? ALT 38 (H) 07/14/2021  ? AST 23 07/14/2021  ? ALKPHOS 88 07/14/2021  ? BILITOT 0.2 07/14/2021  ? ?Lab Results  ?Component Value Date  ? HGBA1C 5.9 (H) 07/14/2021  ? HGBA1C 5.5 07/07/2020  ? HGBA1C 5.7 (H) 03/30/2020  ? HGBA1C 5.8 (H) 11/28/2019  ? HGBA1C 5.6 07/19/2019  ? ?Lab Results  ?Component Value Date  ? INSULIN 20.5 07/14/2021  ? INSULIN 12.9 03/30/2020  ? INSULIN 10.0 11/28/2019  ? ?Lab Results  ?Component Value Date  ? TSH 3.080 07/14/2021  ? ?Lab Results  ?Component Value Date  ? CHOL 209 (H) 07/14/2021  ? HDL 47 07/14/2021  ? LDLCALC 140 (H) 07/14/2021  ? TRIG 123 07/14/2021  ? CHOLHDL 4.4 07/14/2021  ? ?Lab Results  ?Component Value Date  ? VD25OH 35.1 07/14/2021  ? VD25OH 34.73 07/07/2020  ? VD25OH 46.6 03/30/2020  ? ?Lab Results  ?Component Value Date  ? WBC 6.8 07/14/2021  ? HGB 15.3 07/14/2021  ? HCT 46.1 07/14/2021  ? MCV 83 07/14/2021  ? PLT 403 07/14/2021  ? ?Lab Results  ?Component Value Date  ? IRON 47 07/14/2021  ? TIBC 365 07/14/2021  ? FERRITIN 61 07/14/2021  ? ?Attestation Statements:  ? ?Reviewed by clinician on day of visit: allergies, medications, problem list, medical history, surgical history, family history, social history, and previous encounter notes. ? ? ?I, Trixie Dredge, am acting as transcriptionist for Dennard Nip, MD. ? ?I have reviewed the above documentation for accuracy and completeness, and I agree with the above. -  Dennard Nip, MD ? ? ?

## 2021-09-22 ENCOUNTER — Other Ambulatory Visit (INDEPENDENT_AMBULATORY_CARE_PROVIDER_SITE_OTHER): Payer: Self-pay | Admitting: Family Medicine

## 2021-09-22 DIAGNOSIS — F3289 Other specified depressive episodes: Secondary | ICD-10-CM

## 2021-09-30 ENCOUNTER — Encounter (INDEPENDENT_AMBULATORY_CARE_PROVIDER_SITE_OTHER): Payer: Self-pay | Admitting: Family Medicine

## 2021-09-30 ENCOUNTER — Ambulatory Visit (INDEPENDENT_AMBULATORY_CARE_PROVIDER_SITE_OTHER): Payer: BC Managed Care – PPO | Admitting: Family Medicine

## 2021-09-30 VITALS — BP 114/78 | HR 68 | Temp 98.1°F | Ht 67.0 in | Wt 266.0 lb

## 2021-09-30 DIAGNOSIS — E66813 Obesity, class 3: Secondary | ICD-10-CM

## 2021-09-30 DIAGNOSIS — F3289 Other specified depressive episodes: Secondary | ICD-10-CM

## 2021-09-30 DIAGNOSIS — E669 Obesity, unspecified: Secondary | ICD-10-CM

## 2021-09-30 DIAGNOSIS — Z6841 Body Mass Index (BMI) 40.0 and over, adult: Secondary | ICD-10-CM

## 2021-09-30 DIAGNOSIS — E559 Vitamin D deficiency, unspecified: Secondary | ICD-10-CM

## 2021-09-30 MED ORDER — BUPROPION HCL ER (SR) 150 MG PO TB12: 150.0000 mg | ORAL_TABLET | Freq: Every day | ORAL | 0 refills | Status: DC

## 2021-09-30 MED ORDER — VITAMIN D (ERGOCALCIFEROL) 1.25 MG (50000 UNIT) PO CAPS: 50000.0000 [IU] | ORAL_CAPSULE | ORAL | 0 refills | Status: DC

## 2021-10-14 NOTE — Progress Notes (Signed)
Chief Complaint:   OBESITY Deborah Underwood is here to discuss her progress with her obesity treatment plan along with follow-up of her obesity related diagnoses. Deborah Underwood is on keeping a food journal and adhering to recommended goals of 1300-1500 calories and 90 grams of protein daily and states she is following her eating plan approximately 80% of the time. Deborah Underwood states she is doing 0 minutes 0 times per week.  Today's visit was #: 5 Starting weight: 263 lbs Starting date: 07/14/2021 Today's weight: 266 lbs Today's date: 09/30/2021 Total lbs lost to date: 0 Total lbs lost since last in-office visit: 1  Interim History: Deborah Underwood is working on weight loss and she is journaling more. She did some celebration eating over Mother's Day. She is also trying to do intermittent fasting on her own.  Subjective:   1. Vitamin D deficiency Deborah Underwood is on Vitamin D prescription, and she requests a refill. No side effects were noted.   2. Other depression, with emotional eating Deborah Underwood continues to work on decreasing emotional eating behaviors. She has no problems with Wellbutrin.  Assessment/Plan:   1. Vitamin D deficiency We will refill prescription Vitamin D for 1 month. Deborah Underwood will follow-up for routine testing of Vitamin D, at least 2-3 times per year to avoid over-replacement.  - Vitamin D, Ergocalciferol, (DRISDOL) 1.25 MG (50000 UNIT) CAPS capsule; Take 1 capsule (50,000 Units total) by mouth every 7 (seven) days.  Dispense: 4 capsule; Refill: 0  2. Other depression, with emotional eating We will refill Wellbutrin SR for 1 month. Behavior modification techniques were discussed today to help Deborah Underwood deal with her emotional/non-hunger eating behaviors.  Orders and follow up as documented in patient record.   - buPROPion (WELLBUTRIN SR) 150 MG 12 hr tablet; Take 1 tablet (150 mg total) by mouth daily. Take in the AM.  Dispense: 30 tablet; Refill: 0  3. Obesity, Current BMI 41.7 [place] is currently in the action stage  of change. As such, her goal is to continue with weight loss efforts. She has agreed to keeping a food journal and adhering to recommended goals of 1300-1500 calories and 90+ grams of protein daily.   Behavioral modification strategies: increasing lean protein intake.  Deborah Underwood has agreed to follow-up with our clinic in 3 weeks. She was informed of the importance of frequent follow-up visits to maximize her success with intensive lifestyle modifications for her multiple health conditions.   Objective:   Blood pressure 114/78, pulse 68, temperature 98.1 F (36.7 C), height 5\' 7"  (1.702 m), weight 266 lb (120.7 kg), SpO2 98 %. Body mass index is 41.66 kg/m.  General: Cooperative, alert, well developed, in no acute distress. HEENT: Conjunctivae and lids unremarkable. Cardiovascular: Regular rhythm.  Lungs: Normal work of breathing. Neurologic: No focal deficits.   Lab Results  Component Value Date   CREATININE 0.87 07/14/2021   BUN 10 07/14/2021   NA 137 07/14/2021   K 4.7 07/14/2021   CL 100 07/14/2021   CO2 23 07/14/2021   Lab Results  Component Value Date   ALT 38 (H) 07/14/2021   AST 23 07/14/2021   ALKPHOS 88 07/14/2021   BILITOT 0.2 07/14/2021   Lab Results  Component Value Date   HGBA1C 5.9 (H) 07/14/2021   HGBA1C 5.5 07/07/2020   HGBA1C 5.7 (H) 03/30/2020   HGBA1C 5.8 (H) 11/28/2019   HGBA1C 5.6 07/19/2019   Lab Results  Component Value Date   INSULIN 20.5 07/14/2021   INSULIN 12.9 03/30/2020  INSULIN 10.0 11/28/2019   Lab Results  Component Value Date   TSH 3.080 07/14/2021   Lab Results  Component Value Date   CHOL 209 (H) 07/14/2021   HDL 47 07/14/2021   LDLCALC 140 (H) 07/14/2021   TRIG 123 07/14/2021   CHOLHDL 4.4 07/14/2021   Lab Results  Component Value Date   VD25OH 35.1 07/14/2021   VD25OH 34.73 07/07/2020   VD25OH 46.6 03/30/2020   Lab Results  Component Value Date   WBC 6.8 07/14/2021   HGB 15.3 07/14/2021   HCT 46.1 07/14/2021    MCV 83 07/14/2021   PLT 403 07/14/2021   Lab Results  Component Value Date   IRON 47 07/14/2021   TIBC 365 07/14/2021   FERRITIN 61 07/14/2021   Attestation Statements:   Reviewed by clinician on day of visit: allergies, medications, problem list, medical history, surgical history, family history, social history, and previous encounter notes.   I, Burt Knack, am acting as transcriptionist for Quillian Quince, MD.  I have reviewed the above documentation for accuracy and completeness, and I agree with the above. -  Quillian Quince, MD

## 2021-10-22 ENCOUNTER — Other Ambulatory Visit (INDEPENDENT_AMBULATORY_CARE_PROVIDER_SITE_OTHER): Payer: Self-pay | Admitting: Family Medicine

## 2021-10-22 DIAGNOSIS — F3289 Other specified depressive episodes: Secondary | ICD-10-CM

## 2021-10-25 ENCOUNTER — Encounter (INDEPENDENT_AMBULATORY_CARE_PROVIDER_SITE_OTHER): Payer: Self-pay | Admitting: Family Medicine

## 2021-10-25 ENCOUNTER — Ambulatory Visit (INDEPENDENT_AMBULATORY_CARE_PROVIDER_SITE_OTHER): Payer: BC Managed Care – PPO | Admitting: Family Medicine

## 2021-10-25 VITALS — BP 130/75 | HR 63 | Temp 97.7°F | Ht 67.0 in | Wt 265.0 lb

## 2021-10-25 DIAGNOSIS — F3289 Other specified depressive episodes: Secondary | ICD-10-CM | POA: Diagnosis not present

## 2021-10-25 DIAGNOSIS — E669 Obesity, unspecified: Secondary | ICD-10-CM

## 2021-10-25 DIAGNOSIS — Z9189 Other specified personal risk factors, not elsewhere classified: Secondary | ICD-10-CM

## 2021-10-25 DIAGNOSIS — E559 Vitamin D deficiency, unspecified: Secondary | ICD-10-CM | POA: Diagnosis not present

## 2021-10-25 DIAGNOSIS — Z6841 Body Mass Index (BMI) 40.0 and over, adult: Secondary | ICD-10-CM | POA: Diagnosis not present

## 2021-10-25 DIAGNOSIS — E66813 Obesity, class 3: Secondary | ICD-10-CM

## 2021-10-25 MED ORDER — BUPROPION HCL ER (SR) 200 MG PO TB12: 200.0000 mg | ORAL_TABLET | Freq: Every day | ORAL | 0 refills | Status: DC

## 2021-10-25 MED ORDER — VITAMIN D (ERGOCALCIFEROL) 1.25 MG (50000 UNIT) PO CAPS: 50000.0000 [IU] | ORAL_CAPSULE | ORAL | 0 refills | Status: DC

## 2021-10-25 NOTE — Progress Notes (Unsigned)
Chief Complaint:   OBESITY Deborah Underwood is here to discuss her progress with her obesity treatment plan along with follow-up of her obesity related diagnoses. Deborah Underwood is on keeping a food journal and adhering to recommended goals of 1300-1500 calories and 90+ grams of protein daily and states she is following her eating plan approximately 50% of the time. Deborah Underwood states she is doing 0 minutes 0 times per week.  Today's visit was #: 6 Starting weight: 263 lbs Starting date: 07/14/2021 Today's weight: 265 lbs Today's date: 10/25/2021 Total lbs lost to date: 0 Total lbs lost since last in-office visit: 1  Interim History: Deborah Underwood continues to do well with weight loss despite extra stress.  She will be leaving her current job soon and starting another, and will need to make strategies to fit her new routine.  Subjective:   1. Vitamin D deficiency Deborah Underwood is stable on vitamin D, and she is due for labs soon.  2. Other depression, with emotional eating Deborah Underwood is on medicine and she is doing well, but her stress is high now and she notes increased sugar cravings.  3. At risk for heart disease Deborah Underwood is at higher than average risk for cardiovascular disease due to obesity.  Assessment/Plan:   1. Vitamin D deficiency We will refill prescription Vitamin D for 1 month, and we will recheck labs in 1 month.  Deborah Underwood will follow-up for routine testing of Vitamin D, at least 2-3 times per year to avoid over-replacement.  - Vitamin D, Ergocalciferol, (DRISDOL) 1.25 MG (50000 UNIT) CAPS capsule; Take 1 capsule (50,000 Units total) by mouth every 7 (seven) days.  Dispense: 4 capsule; Refill: 0  2. Other depression, with emotional eating Deborah Underwood agreed to increase Wellbutrin SR to 200 mg every morning with no refills.  Behavior modification techniques were discussed today to help Deborah Underwood deal with her emotional/non-hunger eating behaviors.  Orders and follow up as documented in patient record.   - buPROPion (WELLBUTRIN SR) 200  MG 12 hr tablet; Take 1 tablet (200 mg total) by mouth daily. Take in the AM.  Dispense: 30 tablet; Refill: 0  3. At risk for heart disease Deborah Underwood was given approximately 15 minutes of coronary artery disease prevention counseling today. She is 51 y.o. female and has risk factors for heart disease including obesity. We discussed intensive lifestyle modifications today with an emphasis on specific weight loss instructions and strategies.  Repetitive spaced learning was employed today to elicit superior memory formation and behavioral change.   4. Obesity, Current BMI 41.6 Deborah Underwood is currently in the action stage of change. As such, her goal is to continue with weight loss efforts. She has agreed to keeping a food journal and adhering to recommended goals of 1300-1500 calories and 90+ grams of protein daily.   High protein "real food" recipes were given.  Behavioral modification strategies: meal planning and cooking strategies.  Deborah Underwood has agreed to follow-up with our clinic in 4 weeks. She was informed of the importance of frequent follow-up visits to maximize her success with intensive lifestyle modifications for her multiple health conditions.   Objective:   Blood pressure 130/75, pulse 63, temperature 97.7 F (36.5 C), height 5\' 7"  (1.702 m), weight 265 lb (120.2 kg), SpO2 99 %. Body mass index is 41.5 kg/m.  General: Cooperative, alert, well developed, in no acute distress. HEENT: Conjunctivae and lids unremarkable. Cardiovascular: Regular rhythm.  Lungs: Normal work of breathing. Neurologic: No focal deficits.   Lab Results  Component  Value Date   CREATININE 0.87 07/14/2021   BUN 10 07/14/2021   NA 137 07/14/2021   K 4.7 07/14/2021   CL 100 07/14/2021   CO2 23 07/14/2021   Lab Results  Component Value Date   ALT 38 (H) 07/14/2021   AST 23 07/14/2021   ALKPHOS 88 07/14/2021   BILITOT 0.2 07/14/2021   Lab Results  Component Value Date   HGBA1C 5.9 (H) 07/14/2021   HGBA1C  5.5 07/07/2020   HGBA1C 5.7 (H) 03/30/2020   HGBA1C 5.8 (H) 11/28/2019   HGBA1C 5.6 07/19/2019   Lab Results  Component Value Date   INSULIN 20.5 07/14/2021   INSULIN 12.9 03/30/2020   INSULIN 10.0 11/28/2019   Lab Results  Component Value Date   TSH 3.080 07/14/2021   Lab Results  Component Value Date   CHOL 209 (H) 07/14/2021   HDL 47 07/14/2021   LDLCALC 140 (H) 07/14/2021   TRIG 123 07/14/2021   CHOLHDL 4.4 07/14/2021   Lab Results  Component Value Date   VD25OH 35.1 07/14/2021   VD25OH 34.73 07/07/2020   VD25OH 46.6 03/30/2020   Lab Results  Component Value Date   WBC 6.8 07/14/2021   HGB 15.3 07/14/2021   HCT 46.1 07/14/2021   MCV 83 07/14/2021   PLT 403 07/14/2021   Lab Results  Component Value Date   IRON 47 07/14/2021   TIBC 365 07/14/2021   FERRITIN 61 07/14/2021   Attestation Statements:   Reviewed by clinician on day of visit: allergies, medications, problem list, medical history, surgical history, family history, social history, and previous encounter notes.    I, Trixie Dredge, am acting as transcriptionist for Dennard Nip, MD.  I have reviewed the above documentation for accuracy and completeness, and I agree with the above. -  Dennard Nip, MD

## 2021-11-08 ENCOUNTER — Other Ambulatory Visit (INDEPENDENT_AMBULATORY_CARE_PROVIDER_SITE_OTHER): Payer: Self-pay | Admitting: Family Medicine

## 2021-11-08 DIAGNOSIS — F3289 Other specified depressive episodes: Secondary | ICD-10-CM

## 2021-11-12 DIAGNOSIS — M79672 Pain in left foot: Secondary | ICD-10-CM | POA: Diagnosis not present

## 2021-11-25 ENCOUNTER — Ambulatory Visit (INDEPENDENT_AMBULATORY_CARE_PROVIDER_SITE_OTHER): Payer: BC Managed Care – PPO | Admitting: Family Medicine

## 2021-12-02 ENCOUNTER — Ambulatory Visit (INDEPENDENT_AMBULATORY_CARE_PROVIDER_SITE_OTHER): Payer: BC Managed Care – PPO | Admitting: Family Medicine

## 2021-12-02 ENCOUNTER — Encounter (INDEPENDENT_AMBULATORY_CARE_PROVIDER_SITE_OTHER): Payer: Self-pay | Admitting: Family Medicine

## 2021-12-02 VITALS — BP 128/81 | HR 65 | Temp 98.0°F | Ht 67.0 in | Wt 270.0 lb

## 2021-12-02 DIAGNOSIS — E669 Obesity, unspecified: Secondary | ICD-10-CM

## 2021-12-02 DIAGNOSIS — F3289 Other specified depressive episodes: Secondary | ICD-10-CM | POA: Diagnosis not present

## 2021-12-02 DIAGNOSIS — E7849 Other hyperlipidemia: Secondary | ICD-10-CM

## 2021-12-02 DIAGNOSIS — R7303 Prediabetes: Secondary | ICD-10-CM

## 2021-12-02 DIAGNOSIS — E559 Vitamin D deficiency, unspecified: Secondary | ICD-10-CM

## 2021-12-02 DIAGNOSIS — Z6841 Body Mass Index (BMI) 40.0 and over, adult: Secondary | ICD-10-CM

## 2021-12-02 MED ORDER — BUPROPION HCL ER (SR) 150 MG PO TB12: 150.0000 mg | ORAL_TABLET | Freq: Every day | ORAL | 0 refills | Status: DC

## 2021-12-02 MED ORDER — VITAMIN D (ERGOCALCIFEROL) 1.25 MG (50000 UNIT) PO CAPS: 50000.0000 [IU] | ORAL_CAPSULE | ORAL | 0 refills | Status: DC

## 2021-12-03 LAB — CMP14+EGFR
ALT: 25 IU/L (ref 0–32)
AST: 21 IU/L (ref 0–40)
Albumin/Globulin Ratio: 1.5 (ref 1.2–2.2)
Albumin: 4.3 g/dL (ref 3.8–4.9)
Alkaline Phosphatase: 92 IU/L (ref 44–121)
BUN/Creatinine Ratio: 13 (ref 9–23)
BUN: 11 mg/dL (ref 6–24)
Bilirubin Total: 0.3 mg/dL (ref 0.0–1.2)
CO2: 23 mmol/L (ref 20–29)
Calcium: 9.3 mg/dL (ref 8.7–10.2)
Chloride: 103 mmol/L (ref 96–106)
Creatinine, Ser: 0.87 mg/dL (ref 0.57–1.00)
Globulin, Total: 2.9 g/dL (ref 1.5–4.5)
Glucose: 77 mg/dL (ref 70–99)
Potassium: 4.3 mmol/L (ref 3.5–5.2)
Sodium: 140 mmol/L (ref 134–144)
Total Protein: 7.2 g/dL (ref 6.0–8.5)
eGFR: 81 mL/min/{1.73_m2} (ref >59)

## 2021-12-03 LAB — LIPID PANEL WITH LDL/HDL RATIO
Cholesterol, Total: 206 mg/dL — ABNORMAL HIGH (ref 100–199)
HDL: 51 mg/dL (ref >39)
LDL Chol Calc (NIH): 132 mg/dL — ABNORMAL HIGH (ref 0–99)
LDL/HDL Ratio: 2.6 ratio (ref 0.0–3.2)
Triglycerides: 131 mg/dL (ref 0–149)
VLDL Cholesterol Cal: 23 mg/dL (ref 5–40)

## 2021-12-03 LAB — HEMOGLOBIN A1C
Est. average glucose Bld gHb Est-mCnc: 114 mg/dL
Hgb A1c MFr Bld: 5.6 % (ref 4.8–5.6)

## 2021-12-03 LAB — INSULIN, RANDOM: INSULIN: 6 u[IU]/mL (ref 2.6–24.9)

## 2021-12-03 LAB — VITAMIN D 25 HYDROXY (VIT D DEFICIENCY, FRACTURES): Vit D, 25-Hydroxy: 43.9 ng/mL (ref 30.0–100.0)

## 2021-12-08 NOTE — Progress Notes (Signed)
Chief Complaint:   OBESITY Deborah Underwood is here to discuss her progress with her obesity treatment plan along with follow-up of her obesity related diagnoses. Deborah Underwood is on keeping a food journal and adhering to recommended goals of 1300-1500 calories and 90+ grams of protein daily and states she is following her eating plan approximately 50% of the time. Deborah Underwood states she is walking for 30 minutes 1 time per week.  Today's visit was #: 7 Starting weight: 263 lbs Starting date: 07/14/2021 Today's weight: 270 lbs Today's date: 12/02/2021 Total lbs lost to date: 0 Total lbs lost since last in-office visit: 0  Interim History: Deborah Underwood has had increased stress with changing jobs recently.  She notes increased stress/comfort eating and some decrease in sleep.  She is working on getting back on track.  Subjective:   1. Vitamin D deficiency Deborah Underwood is on vitamin D, and she is due for labs.  2. Other hyperlipidemia Deborah Underwood is working on decreasing cholesterol in her diet.  She denies chest pain.  3. Prediabetes Deborah Underwood is working on her diet, but she has struggled more in the last month.  She is due for labs.  4. Other depression, with emotional eating Deborah Underwood notes increased dry mouth and may be some mental fogginess when we increased her dose.  Assessment/Plan:   1. Vitamin D deficiency We will check labs today, and we will refill prescription vitamin D once weekly for 1 month.  - VITAMIN D 25 Hydroxy (Vit-D Deficiency, Fractures) - Vitamin D, Ergocalciferol, (DRISDOL) 1.25 MG (50000 UNIT) CAPS capsule; Take 1 capsule (50,000 Units total) by mouth every 7 (seven) days.  Dispense: 4 capsule; Refill: 0  2. Other hyperlipidemia We will check labs today.  Deborah Underwood will continue to work on diet, exercise and weight loss efforts. Orders and follow up as documented in patient record.   - Lipid Panel With LDL/HDL Ratio  3. Prediabetes We will check labs today.  Deborah Underwood will continue to work on weight loss, exercise,  and decreasing simple carbohydrates to help decrease the risk of diabetes.   - CMP14+EGFR - Insulin, random - Hemoglobin A1c  4. Other depression, with emotional eating Deborah Underwood agreed to decrease Wellbutrin SR to 150 mg once daily in the morning, and we will refill for 90 days.  - buPROPion (WELLBUTRIN SR) 150 MG 12 hr tablet; Take 1 tablet (150 mg total) by mouth daily. Take in the AM.  Dispense: 90 tablet; Refill: 0  5. Obesity, Current BMI 42.4 Deborah Underwood is currently in the action stage of change. As such, her goal is to continue with weight loss efforts. She has agreed to keeping a food journal and adhering to recommended goals of 1300-1500 calories and 90+ grams of protein daily.   Exercise goals: As is.   Behavioral modification strategies: increasing lean protein intake and meal planning and cooking strategies.  Deborah Underwood has agreed to follow-up with our clinic in 1 week to discuss labs. She was informed of the importance of frequent follow-up visits to maximize her success with intensive lifestyle modifications for her multiple health conditions.   Deborah Underwood was informed we would discuss her lab results at her next visit unless there is a critical issue that needs to be addressed sooner. Deborah Underwood agreed to keep her next visit at the agreed upon time to discuss these results.  Objective:   Blood pressure 128/81, pulse 65, temperature 98 F (36.7 C), height 5' 7"  (1.702 m), weight 270 lb (122.5 kg), SpO2 99 %.  Body mass index is 42.29 kg/m.  General: Cooperative, alert, well developed, in no acute distress. HEENT: Conjunctivae and lids unremarkable. Cardiovascular: Regular rhythm.  Lungs: Normal work of breathing. Neurologic: No focal deficits.   Lab Results  Component Value Date   CREATININE 0.87 12/02/2021   BUN 11 12/02/2021   NA 140 12/02/2021   K 4.3 12/02/2021   CL 103 12/02/2021   CO2 23 12/02/2021   Lab Results  Component Value Date   ALT 25 12/02/2021   AST 21 12/02/2021    ALKPHOS 92 12/02/2021   BILITOT 0.3 12/02/2021   Lab Results  Component Value Date   HGBA1C 5.6 12/02/2021   HGBA1C 5.9 (H) 07/14/2021   HGBA1C 5.5 07/07/2020   HGBA1C 5.7 (H) 03/30/2020   HGBA1C 5.8 (H) 11/28/2019   Lab Results  Component Value Date   INSULIN 6.0 12/02/2021   INSULIN 20.5 07/14/2021   INSULIN 12.9 03/30/2020   INSULIN 10.0 11/28/2019   Lab Results  Component Value Date   TSH 3.080 07/14/2021   Lab Results  Component Value Date   CHOL 206 (H) 12/02/2021   HDL 51 12/02/2021   LDLCALC 132 (H) 12/02/2021   TRIG 131 12/02/2021   CHOLHDL 4.4 07/14/2021   Lab Results  Component Value Date   VD25OH 43.9 12/02/2021   VD25OH 35.1 07/14/2021   VD25OH 34.73 07/07/2020   Lab Results  Component Value Date   WBC 6.8 07/14/2021   HGB 15.3 07/14/2021   HCT 46.1 07/14/2021   MCV 83 07/14/2021   PLT 403 07/14/2021   Lab Results  Component Value Date   IRON 47 07/14/2021   TIBC 365 07/14/2021   FERRITIN 61 07/14/2021   Attestation Statements:   Reviewed by clinician on day of visit: allergies, medications, problem list, medical history, surgical history, family history, social history, and previous encounter notes.   I, Trixie Dredge, am acting as transcriptionist for Dennard Nip, MD.  I have reviewed the above documentation for accuracy and completeness, and I agree with the above. -  Dennard Nip, MD

## 2021-12-09 ENCOUNTER — Encounter (INDEPENDENT_AMBULATORY_CARE_PROVIDER_SITE_OTHER): Payer: Self-pay | Admitting: Family Medicine

## 2021-12-09 ENCOUNTER — Ambulatory Visit (INDEPENDENT_AMBULATORY_CARE_PROVIDER_SITE_OTHER): Payer: BC Managed Care – PPO | Admitting: Family Medicine

## 2021-12-09 VITALS — BP 98/69 | HR 71 | Temp 98.0°F | Ht 67.0 in | Wt 270.0 lb

## 2021-12-09 DIAGNOSIS — R7303 Prediabetes: Secondary | ICD-10-CM

## 2021-12-09 DIAGNOSIS — K760 Fatty (change of) liver, not elsewhere classified: Secondary | ICD-10-CM | POA: Diagnosis not present

## 2021-12-09 DIAGNOSIS — E66813 Obesity, class 3: Secondary | ICD-10-CM

## 2021-12-09 DIAGNOSIS — E669 Obesity, unspecified: Secondary | ICD-10-CM

## 2021-12-09 DIAGNOSIS — E7849 Other hyperlipidemia: Secondary | ICD-10-CM | POA: Diagnosis not present

## 2021-12-09 DIAGNOSIS — Z6841 Body Mass Index (BMI) 40.0 and over, adult: Secondary | ICD-10-CM

## 2021-12-09 DIAGNOSIS — E559 Vitamin D deficiency, unspecified: Secondary | ICD-10-CM

## 2021-12-15 NOTE — Progress Notes (Unsigned)
Chief Complaint:   OBESITY Deborah Underwood is here to discuss her progress with her obesity treatment plan along with follow-up of her obesity related diagnoses. Deborah Underwood is on keeping a food journal and adhering to recommended goals of 1300-1500 calories and 90+ grams of protein daily and states she is following her eating plan approximately 60% of the time. Deborah Underwood states she is walking for 30 minutes 1-2 times per week.  Today's visit was #: 8 Starting weight: 263 lbs Starting date: 07/14/2021 Today's weight: 270 lbs Today's date: 12/09/2021 Total lbs lost to date: 0 Total lbs lost since last in-office visit: 0  Interim History: Deborah Underwood has a lot of questions about her recent labs and how this affects her weight and health.  Subjective:   1. Prediabetes Deborah Underwood's A1c, glucose, and insulin are all improving with the diet and exercise changes she has made.  She has questions about insulin resistance and causes.  2. NAFLD (nonalcoholic fatty liver disease) Deborah Underwood's LFT is within normal limits again.  She has questions about how LFT relates to fatty liver and the importance of nutrition and exercise for treatment.  3. Other hyperlipidemia Deborah Underwood's HDL and triglycerides are at goal, but her LDL is still uncontrolled.  4. Vitamin D deficiency Deborah Underwood's vitamin D level is slowly improving, but not yet at goal.  Assessment/Plan:   1. Prediabetes Deborah Underwood was educated on incidence, causes, and genetics of insulin resistance and prediabetes mellitus.  She was encouraged to continue with her lifestyle changes to prevent exacerbation.  2. NAFLD (nonalcoholic fatty liver disease) We discussed the fatty liver, causes, and treatments in depth.  Deborah Underwood will continue to work on her diet and exercise and we will continue to follow.  3. Other hyperlipidemia Deborah Underwood will continue to work on decreasing cholesterol in her diet and will continue to work on weight loss.  We will continue to monitor.  4. Vitamin D deficiency Deborah Underwood  will continue vitamin D, and we will recheck labs in 3 months.  5. Obesity, Current BMI 42.4 Deborah Underwood is currently in the action stage of change. As such, her goal is to continue with weight loss efforts. She has agreed to keeping a food journal and adhering to recommended goals of 1300-1500 calories and 90 grams of protein daily.   Exercise goals: As is.   Behavioral modification strategies: increasing water intake.  Deborah Underwood has agreed to follow-up with our clinic in 4 weeks. She was informed of the importance of frequent follow-up visits to maximize her success with intensive lifestyle modifications for her multiple health conditions.   Objective:   Blood pressure 98/69, pulse 71, temperature 98 F (36.7 C), height 5\' 7"  (1.702 m), weight 270 lb (122.5 kg), SpO2 99 %. Body mass index is 42.29 kg/m.  General: Cooperative, alert, well developed, in no acute distress. HEENT: Conjunctivae and lids unremarkable. Cardiovascular: Regular rhythm.  Lungs: Normal work of breathing. Neurologic: No focal deficits.   Lab Results  Component Value Date   CREATININE 0.87 12/02/2021   BUN 11 12/02/2021   NA 140 12/02/2021   K 4.3 12/02/2021   CL 103 12/02/2021   CO2 23 12/02/2021   Lab Results  Component Value Date   ALT 25 12/02/2021   AST 21 12/02/2021   ALKPHOS 92 12/02/2021   BILITOT 0.3 12/02/2021   Lab Results  Component Value Date   HGBA1C 5.6 12/02/2021   HGBA1C 5.9 (H) 07/14/2021   HGBA1C 5.5 07/07/2020   HGBA1C 5.7 (H) 03/30/2020  HGBA1C 5.8 (H) 11/28/2019   Lab Results  Component Value Date   INSULIN 6.0 12/02/2021   INSULIN 20.5 07/14/2021   INSULIN 12.9 03/30/2020   INSULIN 10.0 11/28/2019   Lab Results  Component Value Date   TSH 3.080 07/14/2021   Lab Results  Component Value Date   CHOL 206 (H) 12/02/2021   HDL 51 12/02/2021   LDLCALC 132 (H) 12/02/2021   TRIG 131 12/02/2021   CHOLHDL 4.4 07/14/2021   Lab Results  Component Value Date   VD25OH 43.9  12/02/2021   VD25OH 35.1 07/14/2021   VD25OH 34.73 07/07/2020   Lab Results  Component Value Date   WBC 6.8 07/14/2021   HGB 15.3 07/14/2021   HCT 46.1 07/14/2021   MCV 83 07/14/2021   PLT 403 07/14/2021   Lab Results  Component Value Date   IRON 47 07/14/2021   TIBC 365 07/14/2021   FERRITIN 61 07/14/2021   Attestation Statements:   Reviewed by clinician on day of visit: allergies, medications, problem list, medical history, surgical history, family history, social history, and previous encounter notes.  Time spent on visit including pre-visit chart review and post-visit care and charting was 45 minutes.   I, Burt Knack, am acting as transcriptionist for Quillian Quince, MD.  I have reviewed the above documentation for accuracy and completeness, and I agree with the above. -  Quillian Quince, MD

## 2021-12-22 ENCOUNTER — Encounter (INDEPENDENT_AMBULATORY_CARE_PROVIDER_SITE_OTHER): Payer: Self-pay

## 2022-03-26 ENCOUNTER — Other Ambulatory Visit (INDEPENDENT_AMBULATORY_CARE_PROVIDER_SITE_OTHER): Payer: Self-pay | Admitting: Family Medicine

## 2022-03-26 DIAGNOSIS — E559 Vitamin D deficiency, unspecified: Secondary | ICD-10-CM

## 2022-03-26 DIAGNOSIS — F3289 Other specified depressive episodes: Secondary | ICD-10-CM

## 2022-04-04 NOTE — Progress Notes (Unsigned)
HPI: Deborah Underwood is a 51 y.o. female, who is here today for her routine physical.  Last CPE: ***  Regular exercise 3 or more time per week: *** Following a healthy diet: ***  Chronic medical problems: ***   There is no immunization history on file for this patient. Health Maintenance  Topic Date Due   COVID-19 Vaccine (1) Never done   HIV Screening  Never done   Hepatitis C Screening  Never done   Zoster Vaccines- Shingrix (1 of 2) Never done   COLONOSCOPY (Pts 45-53yrs Insurance coverage will need to be confirmed)  Never done   Lung Cancer Screening  Never done   MAMMOGRAM  Never done   INFLUENZA VACCINE  Never done   PAP SMEAR-Modifier  06/13/2022   HPV VACCINES  Aged Out    She has *** concerns today.  Review of Systems  Current Outpatient Medications on File Prior to Visit  Medication Sig Dispense Refill   Barberry-Oreg Grape-Goldenseal (BERBERINE COMPLEX) 200-200-50 MG CAPS Take by mouth.     buPROPion (WELLBUTRIN SR) 150 MG 12 hr tablet Take 1 tablet (150 mg total) by mouth daily. Take in the AM. 90 tablet 0   Ferrous Sulfate (IRON) 325 (65 Fe) MG TABS Take 1 tablet (325 mg total) by mouth daily. 30 tablet 0   Turmeric 500 MG CAPS Take by mouth.     Vitamin D, Ergocalciferol, (DRISDOL) 1.25 MG (50000 UNIT) CAPS capsule Take 1 capsule (50,000 Units total) by mouth every 7 (seven) days. 4 capsule 0   No current facility-administered medications on file prior to visit.    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    Allergies  Allergen Reactions   Latex Itching   Cephalexin Rash   Penicillin G Rash   Other     Family History  Problem Relation Age of Onset   Cancer Mother    Arthritis Mother    Hearing loss Mother    Depression Mother    Anxiety disorder Mother    Obesity Mother    Hearing loss Father    Heart disease Father    Hypertension Father    Depression Father    Anxiety disorder Father    Obesity Father    Alcohol abuse Paternal Grandfather     Social History   Socioeconomic History   Marital status: Married    Spouse name: Micah Noel    Number of children: 1   Years of education: Not on file   Highest education level: Not on file  Occupational History   Occupation: Risk analyst  Tobacco Use   Smoking status: Former    Packs/day: 1.50    Years: 17.00    Total pack years: 25.50    Types: Cigarettes    Quit date: 05/16/2009  Years since quitting: 12.8   Smokeless tobacco: Never  Vaping Use   Vaping Use: Never used  Substance and Sexual Activity   Alcohol use: Yes   Drug use: Never   Sexual activity: Not Currently    Partners: Male  Other Topics Concern   Not on file  Social History Narrative   Not on file   Social Determinants of Health   Financial Resource Strain: Not on file  Food Insecurity: Not on file  Transportation Needs: Not on file  Physical Activity: Not on file  Stress: Not on file  Social Connections: Not on file    There were no vitals filed for this visit. There is no height or weight on file to calculate BMI.  Wt Readings from Last 3 Encounters:  12/09/21 270 lb (122.5 kg)  12/02/21 270 lb (122.5 kg)  10/25/21 265 lb (120.2 kg)    Physical Exam  ASSESSMENT AND PLAN: Ms. Deborah Underwood was here today annual physical examination.  No orders of the defined types were placed in this encounter.   There are no diagnoses linked to this encounter.  There are no diagnoses linked to this  encounter.  No follow-ups on file.  Justn Quale G. [place], MD  [facility]. [place] office.

## 2022-04-05 ENCOUNTER — Encounter: Payer: Self-pay | Admitting: Family Medicine

## 2022-04-05 ENCOUNTER — Ambulatory Visit: Payer: PRIVATE HEALTH INSURANCE | Admitting: Family Medicine

## 2022-04-05 VITALS — BP 128/80 | HR 90 | Temp 97.9°F | Resp 12 | Ht 67.0 in | Wt 278.4 lb

## 2022-04-05 DIAGNOSIS — Z0000 Encounter for general adult medical examination without abnormal findings: Secondary | ICD-10-CM

## 2022-04-05 DIAGNOSIS — E559 Vitamin D deficiency, unspecified: Secondary | ICD-10-CM

## 2022-04-05 DIAGNOSIS — Z6841 Body Mass Index (BMI) 40.0 and over, adult: Secondary | ICD-10-CM | POA: Diagnosis not present

## 2022-04-05 NOTE — Assessment & Plan Note (Addendum)
We discussed the importance of regular physical activity and healthy diet for prevention of chronic illness and/or complications. Preventive guidelines reviewed. She is most likely having labs at the wt management center, so no labs today. Vaccination: Declined flu vaccine. We decided to hold on Shingrix.  Because lack of insurance coverage she prefers to hold on mammogram,lung cancer screening,and cologuard. She is going to call insurance and inquire about coverage, she will let me know. Next CPE in a year.  The 10-year ASCVD risk score (Arnett DK, et al., 2019) is: 1.5%   Values used to calculate the score:     Age: 51 years     Sex: Female     Is Non-Hispanic African American: No     Diabetic: No     Tobacco smoker: No     Systolic Blood Pressure: 128 mmHg     Is BP treated: No     HDL Cholesterol: 51 mg/dL     Total Cholesterol: 206 mg/dL

## 2022-04-05 NOTE — Assessment & Plan Note (Addendum)
She has not taken ergocalciferol 50,000 U weekly for a few months.  We discussed some side effects of medication. Recommend waiting until she has 25 OH vit D re-checked.

## 2022-04-05 NOTE — Patient Instructions (Addendum)
A few things to remember from today's visit:  Routine general medical examination at a health care facility  Keep appt with wt loss clinic, they will fill your prescriptions if still appropriate. Let me know if your insurance will cover cologuard,lung cancer screening,and mammogram.  Health Maintenance  Topic Date Due   Hepatitis C Screening: USPSTF Recommendation to screen - Ages 74-51 yo.  Never done   Zoster (Shingles) Vaccine (1 of 2) Never done   Colon Cancer Screening  Never done   Screening for Lung Cancer  Never done   Mammogram  Never done   Flu Shot  Never done   COVID-19 Vaccine (1) 04/21/2022*   HIV Screening  04/06/2027*   Pap Smear  06/13/2022   HPV Vaccine  Aged Out  *Topic was postponed. The date shown is not the original due date.   There is no immunization history on file for this patient.  If you need refills for medications you take chronically, please call your pharmacy. Do not use My Chart to request refills or for acute issues that need immediate attention. If you send a my chart message, it may take a few days to be addressed, specially if I am not in the office.  Please be sure medication list is accurate. If a new problem present, please set up appointment sooner than planned today.  Health Maintenance, Female Adopting a healthy lifestyle and getting preventive care are important in promoting health and wellness. Ask your health care provider about: The right schedule for you to have regular tests and exams. Things you can do on your own to prevent diseases and keep yourself healthy. What should I know about diet, weight, and exercise? Eat a healthy diet  Eat a diet that includes plenty of vegetables, fruits, low-fat dairy products, and lean protein. Do not eat a lot of foods that are high in solid fats, added sugars, or sodium. Maintain a healthy weight Body mass index (BMI) is used to identify weight problems. It estimates body fat based on height  and weight. Your health care provider can help determine your BMI and help you achieve or maintain a healthy weight. Get regular exercise Get regular exercise. This is one of the most important things you can do for your health. Most adults should: Exercise for at least 150 minutes each week. The exercise should increase your heart rate and make you sweat (moderate-intensity exercise). Do strengthening exercises at least twice a week. This is in addition to the moderate-intensity exercise. Spend less time sitting. Even light physical activity can be beneficial. Watch cholesterol and blood lipids Have your blood tested for lipids and cholesterol at 51 years of age, then have this test every 5 years. Have your cholesterol levels checked more often if: Your lipid or cholesterol levels are high. You are older than 51 years of age. You are at high risk for heart disease. What should I know about cancer screening? Depending on your health history and family history, you may need to have cancer screening at various ages. This may include screening for: Breast cancer. Cervical cancer. Colorectal cancer. Skin cancer. Lung cancer. What should I know about heart disease, diabetes, and high blood pressure? Blood pressure and heart disease High blood pressure causes heart disease and increases the risk of stroke. This is more likely to develop in people who have high blood pressure readings or are overweight. Have your blood pressure checked: Every 3-5 years if you are 23-68 years of age. Every  year if you are 4 years old or older. Diabetes Have regular diabetes screenings. This checks your fasting blood sugar level. Have the screening done: Once every three years after age 63 if you are at a normal weight and have a low risk for diabetes. More often and at a younger age if you are overweight or have a high risk for diabetes. What should I know about preventing infection? Hepatitis B If you have a  higher risk for hepatitis B, you should be screened for this virus. Talk with your health care provider to find out if you are at risk for hepatitis B infection. Hepatitis C Testing is recommended for: Everyone born from [birth year] through 1965. Anyone with known risk factors for hepatitis C. Sexually transmitted infections (STIs) Get screened for STIs, including gonorrhea and chlamydia, if: You are sexually active and are younger than 51 years of age. You are older than 51 years of age and your health care provider tells you that you are at risk for this type of infection. Your sexual activity has changed since you were last screened, and you are at increased risk for chlamydia or gonorrhea. Ask your health care provider if you are at risk. Ask your health care provider about whether you are at high risk for HIV. Your health care provider may recommend a prescription medicine to help prevent HIV infection. If you choose to take medicine to prevent HIV, you should first get tested for HIV. You should then be tested every 3 months for as long as you are taking the medicine. Pregnancy If you are about to stop having your period (premenopausal) and you may become pregnant, seek counseling before you get pregnant. Take 400 to 800 micrograms (mcg) of folic acid every day if you become pregnant. Ask for birth control (contraception) if you want to prevent pregnancy. Osteoporosis and menopause Osteoporosis is a disease in which the bones lose minerals and strength with aging. This can result in bone fractures. If you are 24 years old or older, or if you are at risk for osteoporosis and fractures, ask your health care provider if you should: Be screened for bone loss. Take a calcium or vitamin D supplement to lower your risk of fractures. Be given hormone replacement therapy (HRT) to treat symptoms of menopause. Follow these instructions at home: Alcohol use Do not drink alcohol if: Your health care  provider tells you not to drink. You are pregnant, may be pregnant, or are planning to become pregnant. If you drink alcohol: Limit how much you have to: 0-1 drink a day. Know how much alcohol is in your drink. In the [place] one drink equals one 12 oz bottle of beer (355 mL), one 5 oz glass of wine (148 mL), or one 1 oz glass of hard liquor (44 mL). Lifestyle Do not use any products that contain nicotine or tobacco. These products include cigarettes, chewing tobacco, and vaping devices, such as e-cigarettes. If you need help quitting, ask your health care provider. Do not use street drugs. Do not share needles. Ask your health care provider for help if you need support or information about quitting drugs. General instructions Schedule regular health, dental, and eye exams. Stay current with your vaccines. Tell your health care provider if: You often feel depressed. You have ever been abused or do not feel safe at home. Summary Adopting a healthy lifestyle and getting preventive care are important in promoting health and wellness. Follow your health care provider's instructions about  healthy diet, exercising, and getting tested or screened for diseases. Follow your health care provider's instructions on monitoring your cholesterol and blood pressure. This information is not intended to replace advice given to you by your health care provider. Make sure you discuss any questions you have with your health care provider. Document Revised: 09/21/2020 Document Reviewed: 09/21/2020 Elsevier Patient Education  2023 [employer].

## 2022-04-05 NOTE — Assessment & Plan Note (Signed)
She understands the benefits of wt loss as well as adverse effects of obesity. Consistency with healthy diet and physical activity encouraged. Meal prep may help. Making positive life style changes, small steps at the time, will provide long lasting results with minimal risk for adverse effects; so I do not recommend pharmacologic treatment. She has an appt with wt loss clinic on 04/11/22.

## 2022-04-08 ENCOUNTER — Encounter: Payer: Self-pay | Admitting: Family Medicine

## 2022-04-11 ENCOUNTER — Ambulatory Visit (INDEPENDENT_AMBULATORY_CARE_PROVIDER_SITE_OTHER): Payer: BC Managed Care – PPO | Admitting: Physician Assistant

## 2022-04-11 ENCOUNTER — Encounter (INDEPENDENT_AMBULATORY_CARE_PROVIDER_SITE_OTHER): Payer: Self-pay

## 2022-04-12 ENCOUNTER — Other Ambulatory Visit: Payer: Self-pay | Admitting: Family Medicine

## 2022-04-12 DIAGNOSIS — Z122 Encounter for screening for malignant neoplasm of respiratory organs: Secondary | ICD-10-CM

## 2022-04-14 ENCOUNTER — Ambulatory Visit (INDEPENDENT_AMBULATORY_CARE_PROVIDER_SITE_OTHER): Payer: PRIVATE HEALTH INSURANCE | Admitting: Family Medicine

## 2022-04-14 ENCOUNTER — Encounter (INDEPENDENT_AMBULATORY_CARE_PROVIDER_SITE_OTHER): Payer: Self-pay | Admitting: Family Medicine

## 2022-04-14 VITALS — BP 122/77 | HR 69 | Temp 97.9°F | Ht 67.0 in | Wt 273.0 lb

## 2022-04-14 DIAGNOSIS — R7303 Prediabetes: Secondary | ICD-10-CM

## 2022-04-14 DIAGNOSIS — E559 Vitamin D deficiency, unspecified: Secondary | ICD-10-CM | POA: Diagnosis not present

## 2022-04-14 DIAGNOSIS — Z6841 Body Mass Index (BMI) 40.0 and over, adult: Secondary | ICD-10-CM

## 2022-04-14 DIAGNOSIS — E669 Obesity, unspecified: Secondary | ICD-10-CM | POA: Diagnosis not present

## 2022-04-14 DIAGNOSIS — F3289 Other specified depressive episodes: Secondary | ICD-10-CM | POA: Diagnosis not present

## 2022-04-14 MED ORDER — TOPIRAMATE 25 MG PO TABS: 25.0000 mg | ORAL_TABLET | Freq: Every day | ORAL | 0 refills | Status: DC

## 2022-04-14 MED ORDER — BUPROPION HCL ER (SR) 150 MG PO TB12: 150.0000 mg | ORAL_TABLET | Freq: Every day | ORAL | 0 refills | Status: DC

## 2022-04-14 MED ORDER — VITAMIN D (ERGOCALCIFEROL) 1.25 MG (50000 UNIT) PO CAPS
50000.0000 [IU] | ORAL_CAPSULE | ORAL | 0 refills | Status: AC
Start: 2022-04-14 — End: ?

## 2022-04-26 NOTE — Progress Notes (Signed)
Chief Complaint:   OBESITY Deborah Underwood is here to discuss her progress with her obesity treatment plan along with follow-up of her obesity related diagnoses. Deborah Underwood is on keeping a food journal and adhering to recommended goals of 1300-1500 calories and 90 grams of protein and states she is following her eating plan approximately 20% of the time. Deborah Underwood states she is walking sometimes.    Today's visit was #: 9 Starting weight: 263 lbs Starting date: 07/14/2021 Today's weight: 273 lbs Today's date: 04/14/2022 Total lbs lost to date: 0 Total lbs lost since last in-office visit: 0  Interim History: Deborah Underwood was mindful over Thanksgiving.  She has a different schedule which increased driving and decreased exercise.  She is trying to work out a new strategy to pack food versus going through the [place].  Subjective:   1. Prediabetes Deborah Underwood tried metformin and she felt it helped her decrease her appetite.  2. Vitamin D deficiency Deborah Underwood is stable on vitamin D, with no side effects noted.  3. Other depression, with emotional eating Deborah Underwood is working on decreasing emotional eating behaviors.  She feels her Wellbutrin is helping with her mood, but she is still struggling with cravings.  Assessment/Plan:   1. Prediabetes Deborah Underwood was encouraged to continue to work on decreasing simple carbohydrates in her diet.  2. Vitamin D deficiency Deborah Underwood will continue prescription Vitamin D 50,000 IU every week, and we will refill for 1 month.   - Vitamin D, Ergocalciferol, (DRISDOL) 1.25 MG (50000 UNIT) CAPS capsule; Take 1 capsule (50,000 Units total) by mouth every 7 (seven) days.  Dispense: 4 capsule; Refill: 0  3. Other depression, with emotional eating Deborah Underwood agreed to start Topamax 25 mg nightly, with no refills; and we will refill Wellbutrin SR for 90 days.  - buPROPion (WELLBUTRIN SR) 150 MG 12 hr tablet; Take 1 tablet (150 mg total) by mouth daily. Take in the AM.  Dispense: 90 tablet; Refill: 0 -  topiramate (TOPAMAX) 25 MG tablet; Take 1 tablet (25 mg total) by mouth daily.  Dispense: 30 tablet; Refill: 0  4. Obesity, Current BMI 49. 8 Deborah Underwood is currently in the action stage of change. As such, her goal is to continue with weight loss efforts. She has agreed to keeping a food journal and adhering to recommended goals of 1500 calories and 90+ grams of protein daily.   Exercise goals: As is.   Behavioral modification strategies: increasing lean protein intake, decreasing simple carbohydrates, emotional eating strategies, and holiday eating strategies .  Deborah Underwood has agreed to follow-up with our clinic in 5 weeks. She was informed of the importance of frequent follow-up visits to maximize her success with intensive lifestyle modifications for her multiple health conditions.   Objective:   Blood pressure 122/77, pulse 69, temperature 97.9 F (36.6 C), height 5\' 7"  (1.702 m), weight 273 lb (123.8 kg), last menstrual period 03/08/2022, SpO2 99 %. Body mass index is 42.76 kg/m.  General: Cooperative, alert, well developed, in no acute distress. HEENT: Conjunctivae and lids unremarkable. Cardiovascular: Regular rhythm.  Lungs: Normal work of breathing. Neurologic: No focal deficits.   Lab Results  Component Value Date   CREATININE 0.87 12/02/2021   BUN 11 12/02/2021   NA 140 12/02/2021   K 4.3 12/02/2021   CL 103 12/02/2021   CO2 23 12/02/2021   Lab Results  Component Value Date   ALT 25 12/02/2021   AST 21 12/02/2021   ALKPHOS 92 12/02/2021   BILITOT 0.3 12/02/2021  Lab Results  Component Value Date   HGBA1C 5.6 12/02/2021   HGBA1C 5.9 (H) 07/14/2021   HGBA1C 5.5 07/07/2020   HGBA1C 5.7 (H) 03/30/2020   HGBA1C 5.8 (H) 11/28/2019   Lab Results  Component Value Date   INSULIN 6.0 12/02/2021   INSULIN 20.5 07/14/2021   INSULIN 12.9 03/30/2020   INSULIN 10.0 11/28/2019   Lab Results  Component Value Date   TSH 3.080 07/14/2021   Lab Results  Component Value Date    CHOL 206 (H) 12/02/2021   HDL 51 12/02/2021   LDLCALC 132 (H) 12/02/2021   TRIG 131 12/02/2021   CHOLHDL 4.4 07/14/2021   Lab Results  Component Value Date   VD25OH 43.9 12/02/2021   VD25OH 35.1 07/14/2021   VD25OH 34.73 07/07/2020   Lab Results  Component Value Date   WBC 6.8 07/14/2021   HGB 15.3 07/14/2021   HCT 46.1 07/14/2021   MCV 83 07/14/2021   PLT 403 07/14/2021   Lab Results  Component Value Date   IRON 47 07/14/2021   TIBC 365 07/14/2021   FERRITIN 61 07/14/2021   Attestation Statements:   Reviewed by clinician on day of visit: allergies, medications, problem list, medical history, surgical history, family history, social history, and previous encounter notes.  Time spent on visit including pre-visit chart review and post-visit care and charting was 42 minutes.   I, Burt Knack, am acting as transcriptionist for Quillian Quince, MD.  I have reviewed the above documentation for accuracy and completeness, and I agree with the above. -  Quillian Quince, MD

## 2022-04-27 ENCOUNTER — Encounter: Payer: Self-pay | Admitting: Family Medicine

## 2022-05-10 ENCOUNTER — Other Ambulatory Visit (INDEPENDENT_AMBULATORY_CARE_PROVIDER_SITE_OTHER): Payer: Self-pay | Admitting: Family Medicine

## 2022-05-10 DIAGNOSIS — F3289 Other specified depressive episodes: Secondary | ICD-10-CM

## 2022-05-18 ENCOUNTER — Ambulatory Visit (INDEPENDENT_AMBULATORY_CARE_PROVIDER_SITE_OTHER): Payer: PRIVATE HEALTH INSURANCE | Admitting: Family Medicine

## 2022-06-05 ENCOUNTER — Encounter: Payer: Self-pay | Admitting: Family Medicine

## 2022-06-21 ENCOUNTER — Telehealth: Payer: Self-pay

## 2022-06-21 NOTE — Telephone Encounter (Signed)
Contacted patient regarding [facility] referral.  Screening questionnaire for eligibility determined patient's pack years are much less than 20 pack years, which makes her ineligible for LDCT.  Patient began smoking around age 52 years and smoked about half to one pack of cigarettes per day.  She then decreased her smoking to only one cigarette per day.  In calculating pack years, this would be far less than 20 pack year requirement for LDCT.  Discussed how we calculated pack years so patient could understand her smoking history.  Patient is very interested in have a CT Chest and would like to discuss further with her PCP.  She is interested in 'peace of mind' to have the scan done, if her provider would recommend a CT Chest wo contrast.  Will close referral and route this conversation to Dr. Martinique for review.  Patient would like to move forward with a CT chest if recommended by PCP.

## 2022-06-22 NOTE — Telephone Encounter (Signed)
Delta, thanks. The computer program calculated 25.5 packs per year. I will discuss this with pt during next follow up viist. Deborah Underwood

## 2022-07-12 ENCOUNTER — Other Ambulatory Visit: Payer: Self-pay | Admitting: Obstetrics and Gynecology

## 2022-07-12 DIAGNOSIS — L988 Other specified disorders of the skin and subcutaneous tissue: Secondary | ICD-10-CM

## 2022-07-20 ENCOUNTER — Other Ambulatory Visit: Payer: Self-pay | Admitting: Obstetrics and Gynecology

## 2022-07-20 DIAGNOSIS — L988 Other specified disorders of the skin and subcutaneous tissue: Secondary | ICD-10-CM

## 2022-09-08 ENCOUNTER — Ambulatory Visit: Admission: RE | Admit: 2022-09-08 | Payer: PRIVATE HEALTH INSURANCE | Source: Ambulatory Visit

## 2022-09-08 ENCOUNTER — Ambulatory Visit
Admission: RE | Admit: 2022-09-08 | Discharge: 2022-09-08 | Disposition: A | Discharge status: Home / Self Care | Payer: PRIVATE HEALTH INSURANCE | Source: Ambulatory Visit | Attending: Obstetrics and Gynecology | Admitting: Obstetrics and Gynecology

## 2022-09-08 DIAGNOSIS — L988 Other specified disorders of the skin and subcutaneous tissue: Secondary | ICD-10-CM

## 2022-10-11 ENCOUNTER — Ambulatory Visit: Payer: PRIVATE HEALTH INSURANCE | Admitting: Internal Medicine

## 2022-10-11 ENCOUNTER — Encounter: Payer: Self-pay | Admitting: Internal Medicine

## 2022-10-11 VITALS — BP 120/80 | HR 91 | Temp 98.5°F | Wt 287.1 lb

## 2022-10-11 DIAGNOSIS — J069 Acute upper respiratory infection, unspecified: Secondary | ICD-10-CM | POA: Diagnosis not present

## 2022-10-11 DIAGNOSIS — J209 Acute bronchitis, unspecified: Secondary | ICD-10-CM | POA: Diagnosis not present

## 2022-10-11 MED ORDER — AZITHROMYCIN 250 MG PO TABS: ORAL_TABLET | ORAL | 0 refills | Status: AC

## 2022-10-11 NOTE — Progress Notes (Signed)
Established Patient Office Visit     CC/Reason for Visit: Cough, congestion.  HPI: Deborah Underwood is a 52 y.o. female who is coming in today for the above mentioned reasons.  For the past week she has been having URI symptoms.  Her 33 year old daughter has been sick as well.  The past 4 days she has noticed the congestion settle in her chest.  She has had a yellow/green productive cough.  She is concerned because she has had bronchitis in the past and feels like this is probably the same.  At that time it did not get better until she was prescribed antibiotics.   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 Her smoking use included cigarettes. She has a 25.50 pack-year smoking history. She has never used smokeless tobacco. She reports current alcohol use. She reports that she does not use drugs.  Allergies: Allergies  Allergen Reactions   Latex Itching   Cephalexin Rash   Penicillin G Rash   Other     Family History:  Family History  Problem Relation Age of Onset   Cancer Mother    Arthritis Mother    Hearing loss Mother    Depression Mother    Anxiety disorder Mother    Obesity Mother    Hearing loss Father    Heart disease Father    Hypertension Father    Depression Father    Anxiety disorder Father    Obesity Father    Alcohol abuse Paternal Grandfather      Current Outpatient  Medications:    azithromycin (ZITHROMAX) 250 MG tablet, Take 2 tablets on day 1, then 1 tablet daily on days 2 through 5, Disp: 6 tablet, Rfl: 0   Ferrous Sulfate (IRON) 325 (65 Fe) MG TABS, Take 1 tablet (325 mg total) by mouth daily., Disp: 30 tablet, Rfl: 0   Vitamin D, Ergocalciferol, (DRISDOL) 1.25 MG (50000 UNIT) CAPS capsule, Take 1 capsule (50,000 Units total) by mouth every 7 (seven) days., Disp: 4 capsule, Rfl: 0  Review of Systems:  Negative unless indicated in HPI.   Physical Exam: Vitals:   10/11/22 1557  BP: 120/80  Pulse: 91  Temp: 98.5 F (36.9 C)  TempSrc: Oral  SpO2: 98%  Weight: 287 lb 1.6 oz (130.2 kg)    Body mass index is 44.97 kg/m.   Physical Exam Vitals reviewed.  Constitutional:      Appearance: Normal appearance.  HENT:     Right Ear: Tympanic membrane, ear canal and external ear normal.     Left Ear: Tympanic membrane, ear canal and external ear normal.     Mouth/Throat:     Mouth: Mucous membranes are moist.  Pharynx: Posterior oropharyngeal erythema present.  Eyes:     Conjunctiva/sclera: Conjunctivae normal.     Pupils: Pupils are equal, round, and reactive to light.  Cardiovascular:     Rate and Rhythm: Normal rate and regular rhythm.  Pulmonary:     Effort: Pulmonary effort is normal.     Breath sounds: Normal breath sounds.  Neurological:     Mental Status: She is alert.      Impression and Plan:  URI with cough and congestion  Acute bronchitis, unspecified organism -     Azithromycin; Take 2 tablets on day 1, then 1 tablet daily on days 2 through 5  Dispense: 6 tablet; Refill: 0  -Given exam findings, PNA, pharyngitis, ear infection are not likely. -Have advised rest, fluids, OTC antihistamines, cough suppressants and mucinex. -RTC if no improvement in 10-14 days. -Given her history I will give her a prescription for a Z-Pak but she will not fill it unless she is still symptomatic after another week.   Time spent:31  minutes reviewing chart, interviewing and examining patient and formulating plan of care.     Chaya Jan, MD [facility] at [facility]

## 2023-03-27 NOTE — Progress Notes (Signed)
ACUTE VISIT No chief complaint on file.  HPI: [name] is a 52 y.o. female with a PMHx significant for prediabetes, depression, HLD, and vitamin D deficiency, who is here today complaining of cough and shoulder pain.   HPI  Review of Systems See other pertinent positives and negatives in HPI.  Current Outpatient Medications on File Prior to Visit  Medication Sig Dispense Refill   Ferrous Sulfate (IRON) 325 (65 Fe) MG TABS Take 1 tablet (325 mg total) by mouth daily. 30 tablet 0   Vitamin D, Ergocalciferol, (DRISDOL) 1.25 MG (50000 UNIT) CAPS capsule Take 1 capsule (50,000 Units total) by mouth every 7 (seven) days. 4 capsule 0   No current facility-administered medications on file prior to visit.    Past Medical History:  Diagnosis Date   Anxiety    Back pain    Depression    Edema of both lower extremities    Fatigue    Fatty liver    Heavy periods    Joint pain    Lower extremity edema    Menopause    PCOS (polycystic ovarian syndrome)    Prediabetes    Shoulder pain    SOB (shortness of breath) on exertion    Vitamin B12 deficiency    Vitamin D deficiency    Allergies  Allergen Reactions   Latex Itching   Cephalexin Rash   Penicillin G Rash   Other     Social History   Socioeconomic History   Marital status: Married    Spouse name: Deborah Underwood    Number of children: 1   Years of education: Not on file   Highest education level: Not on file  Occupational History   Occupation: Risk analyst  Tobacco Use   Smoking status: Former    Current packs/day: 0.00    Average packs/day: 1.5 packs/day for 17.0 years (25.5 ttl pk-yrs)    Types: Cigarettes    Start date: 05/16/1992    Quit date: 05/16/2009    Years since quitting: 13.8   Smokeless tobacco: Never  Vaping Use   Vaping status: Never Used  Substance and Sexual Activity   Alcohol use: Yes   Drug use: Never   Sexual activity: Not Currently    Partners: Male  Other Topics Concern   Not  on file  Social History Narrative   Not on file   Social Determinants of Health   Financial Resource Strain: Not on file  Food Insecurity: Not on file  Transportation Needs: Not on file  Physical Activity: Not on file  Stress: Not on file  Social Connections: Unknown (09/26/2021)   Received from [facility]   Social Network    Social Network: Not on file    There were no vitals filed for this visit. There is no height or weight on file to calculate BMI.  Physical Exam  ASSESSMENT AND PLAN:  Ms. North was seen today for cough and shoulder pain.   There are no diagnoses linked to this encounter.  No follow-ups on file.  I, Deborah Underwood, acting as a scribe for Deborah Spada [place], MD., have documented all relevant documentation on the behalf of Deborah Vasseur [place], MD, as directed by  Deborah Yehle [place], MD while in the presence of Deborah Apodaca [place], MD.   I, Deborah Underwood, have reviewed all documentation for this visit. The documentation on 03/27/23 for the exam, diagnosis, procedures, and orders are all accurate and complete.  Deborah Ingalsbe G. [place], MD  [facility]. [place] office.  Discharge Instructions   None

## 2023-03-28 ENCOUNTER — Ambulatory Visit (INDEPENDENT_AMBULATORY_CARE_PROVIDER_SITE_OTHER): Payer: PRIVATE HEALTH INSURANCE | Admitting: Family Medicine

## 2023-03-28 ENCOUNTER — Encounter: Payer: Self-pay | Admitting: Family Medicine

## 2023-03-28 VITALS — BP 128/80 | HR 85 | Temp 98.5°F | Resp 12 | Ht 67.0 in | Wt 292.0 lb

## 2023-03-28 DIAGNOSIS — M25511 Pain in right shoulder: Secondary | ICD-10-CM

## 2023-03-28 DIAGNOSIS — R053 Chronic cough: Secondary | ICD-10-CM

## 2023-03-28 DIAGNOSIS — D367 Benign neoplasm of other specified sites: Secondary | ICD-10-CM | POA: Diagnosis not present

## 2023-03-28 NOTE — Patient Instructions (Addendum)
A few things to remember from today's visit:  Chronic cough  Right shoulder pain, unspecified chronicity  Benign neoplasm of forearm  Try Flonase nasal spray daily for 3-4 weeks and monitor for changes in cough. ? Acid reflux, if not better we can try 8 weeks of Omeprazole and lastly an inhaler. I would like to have a chest X ray, so please let me know about insurance coverage.  Shoulder pain could be caused by a mild tendinitis, ? Rotator cuff. I do not think we need imaging, PT may help, range of motion exercises at home. Topical voltaren and icy hot.  If you need refills for medications you take chronically, please call your pharmacy. Do not use My Chart to request refills or for acute issues that need immediate attention. If you send a my chart message, it may take a few days to be addressed, specially if I am not in the office.  Please be sure medication list is accurate. If a new problem present, please set up appointment sooner than planned today.

## 2023-03-30 ENCOUNTER — Telehealth: Payer: Self-pay | Admitting: Family Medicine

## 2023-03-30 DIAGNOSIS — R053 Chronic cough: Secondary | ICD-10-CM

## 2023-03-30 NOTE — Telephone Encounter (Signed)
Pt was just seen on Tuesday, 03/28/23. Pt called to inform MD that she still wants to schedule the chest xray. Pt is not sure where MD wants Pt to go to get the xray.  Pt has Brewing technologist, under [employer]. Pt would like assistance verifying where she needs to call to schedule xray, and she wants to make sure they are in network with her insurance.   Please call Pt back to discuss.

## 2023-03-31 NOTE — Telephone Encounter (Signed)
Order in - can be scheduled to be done here.

## 2023-04-03 ENCOUNTER — Other Ambulatory Visit: Payer: Self-pay

## 2023-04-03 DIAGNOSIS — E7849 Other hyperlipidemia: Secondary | ICD-10-CM

## 2023-04-03 DIAGNOSIS — R7303 Prediabetes: Secondary | ICD-10-CM

## 2023-04-03 NOTE — Telephone Encounter (Signed)
Pt called to verify if her insurance will cover the xray. Please call Pt back to discuss.

## 2023-04-04 NOTE — Telephone Encounter (Signed)
I spoke with patient, we went over information below & she verbalized understanding.

## 2023-04-10 ENCOUNTER — Encounter: Payer: Self-pay | Admitting: Family Medicine

## 2023-04-10 ENCOUNTER — Ambulatory Visit (INDEPENDENT_AMBULATORY_CARE_PROVIDER_SITE_OTHER): Payer: PRIVATE HEALTH INSURANCE | Admitting: Family Medicine

## 2023-04-10 ENCOUNTER — Other Ambulatory Visit: Payer: PRIVATE HEALTH INSURANCE

## 2023-04-10 ENCOUNTER — Other Ambulatory Visit (INDEPENDENT_AMBULATORY_CARE_PROVIDER_SITE_OTHER): Payer: PRIVATE HEALTH INSURANCE

## 2023-04-10 ENCOUNTER — Ambulatory Visit: Payer: PRIVATE HEALTH INSURANCE

## 2023-04-10 VITALS — BP 128/70 | HR 100 | Temp 98.6°F | Resp 12 | Ht 67.0 in | Wt 289.4 lb

## 2023-04-10 DIAGNOSIS — Z23 Encounter for immunization: Secondary | ICD-10-CM | POA: Diagnosis not present

## 2023-04-10 DIAGNOSIS — R7303 Prediabetes: Secondary | ICD-10-CM

## 2023-04-10 DIAGNOSIS — E7849 Other hyperlipidemia: Secondary | ICD-10-CM

## 2023-04-10 DIAGNOSIS — R053 Chronic cough: Secondary | ICD-10-CM

## 2023-04-10 DIAGNOSIS — Z1211 Encounter for screening for malignant neoplasm of colon: Secondary | ICD-10-CM

## 2023-04-10 DIAGNOSIS — E66813 Obesity, class 3: Secondary | ICD-10-CM | POA: Diagnosis not present

## 2023-04-10 DIAGNOSIS — Z0000 Encounter for general adult medical examination without abnormal findings: Secondary | ICD-10-CM | POA: Diagnosis not present

## 2023-04-10 DIAGNOSIS — Z6841 Body Mass Index (BMI) 40.0 and over, adult: Secondary | ICD-10-CM

## 2023-04-10 LAB — HEMOGLOBIN A1C: Hgb A1c MFr Bld: 6.1 % (ref 4.6–6.5)

## 2023-04-10 LAB — COMPREHENSIVE METABOLIC PANEL
ALT: 37 U/L — ABNORMAL HIGH (ref 0–35)
AST: 23 U/L (ref 0–37)
Albumin: 4 g/dL (ref 3.5–5.2)
Alkaline Phosphatase: 84 U/L (ref 39–117)
BUN: 15 mg/dL (ref 6–23)
CO2: 27 meq/L (ref 19–32)
Calcium: 9.1 mg/dL (ref 8.4–10.5)
Chloride: 102 meq/L (ref 96–112)
Creatinine, Ser: 0.98 mg/dL (ref 0.40–1.20)
GFR: 66.34 mL/min (ref >60.00)
Glucose, Bld: 94 mg/dL (ref 70–99)
Potassium: 3.7 meq/L (ref 3.5–5.1)
Sodium: 138 meq/L (ref 135–145)
Total Bilirubin: 0.5 mg/dL (ref 0.2–1.2)
Total Protein: 7.1 g/dL (ref 6.0–8.3)

## 2023-04-10 LAB — LIPID PANEL
Cholesterol: 170 mg/dL (ref 0–200)
HDL: 47.5 mg/dL (ref >39.00)
LDL Cholesterol: 102 mg/dL — ABNORMAL HIGH (ref 0–99)
NonHDL: 122.66
Total CHOL/HDL Ratio: 4
Triglycerides: 102 mg/dL (ref 0.0–149.0)
VLDL: 20.4 mg/dL (ref 0.0–40.0)

## 2023-04-10 NOTE — Progress Notes (Addendum)
HPI: [name] is a 52 y.o. female with a PMHx significant for prediabetes, depression, HLD, and vitamin D deficiency, who is here today for her routine physical.  Last CPE: 04/05/2022  Exercise: Patient states she has been walking ~3x per week for 45 minutes.  Diet: She has been eating out 1-2x per week, and has not been eating vegetables daily.  Sleep: She reports 6-6.5 hours of sleep per night.  Alcohol Use: She rarely drinks alcohol.  Smoking: She quit smoking in 2011.  Vision: UTD on routine vision care. Dental: UTD on routine dental care.   She follows regularly with gynecology. Her last appointment was in 08/2022.   Immunization History  Administered Date(s) Administered   Tdap 04/10/2023   Health Maintenance  Topic Date Due   Colonoscopy  Never done   Cervical Cancer Screening (HPV/Pap Cotest)  07/01/2018   Lung Cancer Screening  Never done   Zoster Vaccines- Shingrix (1 of 2) Never done   COVID-19 Vaccine (1 - 2023-24 season) Never done   INFLUENZA VACCINE  08/14/2023 (Originally 12/15/2022)   Hepatitis C Screening  04/09/2024 (Originally 09/19/1988)   HIV Screening  04/06/2027 (Originally 09/19/1985)   MAMMOGRAM  09/07/2024   DTaP/Tdap/Td (2 - Td or Tdap) 04/09/2033   HPV VACCINES  Aged Out   Chronic medical problems:   Hyperlipidemia: Not currently on pharmacologic treatment for hyperlipidemia.  Lab Results  Component Value Date   CHOL 170 04/10/2023   HDL 47.50 04/10/2023   LDLCALC 102 (H) 04/10/2023   TRIG 102.0 04/10/2023   CHOLHDL 4 04/10/2023   Lab Results  Component Value Date   ALT 37 (H) 04/10/2023   AST 23 04/10/2023   ALKPHOS 84 04/10/2023   BILITOT 0.5 04/10/2023   Lab Results  Component Value Date   NA 138 04/10/2023   CL 102 04/10/2023   K 3.7 04/10/2023   CO2 27 04/10/2023   BUN 15 04/10/2023   CREATININE 0.98 04/10/2023   GFR 66.34 04/10/2023   CALCIUM 9.1 04/10/2023   ALBUMIN 4.0 04/10/2023   GLUCOSE 94 04/10/2023    Prediabetes: Negative for polydipsia,polyuria, or polyphagia.  Lab Results  Component Value Date   HGBA1C 6.1 04/10/2023   She says her right shoulder pain has improved since her last visit, 03/28/23.    She is still having some cough since her last visit. She says her cough is improving very gradually.  She had a chest x-ray today.   Review of Systems  Constitutional:  Negative for activity change, appetite change and fever.  HENT:  Negative for mouth sores, sore throat and trouble swallowing.   Eyes:  Negative for redness and visual disturbance.  Respiratory:  Positive for cough. Negative for shortness of breath and wheezing.   Cardiovascular:  Negative for chest pain and leg swelling.  Gastrointestinal:  Negative for abdominal pain, nausea and vomiting.       No changes in bowel habits.  Endocrine: Negative for cold intolerance, heat intolerance, polydipsia, polyphagia and polyuria.  Genitourinary:  Negative for decreased urine volume, dysuria and hematuria.  Musculoskeletal:  Negative for gait problem and myalgias.  Skin:  Negative for color change and rash.  Allergic/Immunologic: Negative for environmental allergies.  Neurological:  Negative for seizures, syncope, weakness and headaches.  Hematological:  Negative for adenopathy. Does not bruise/bleed easily.  Psychiatric/Behavioral:  Negative for confusion. The patient is not nervous/anxious.   All other systems reviewed and are negative.  Current Outpatient Medications on File Prior  to Visit  Medication Sig Dispense Refill   Ferrous Sulfate (IRON) 325 (65 Fe) MG TABS Take 1 tablet (325 mg total) by mouth daily. 30 tablet 0   Vitamin D, Ergocalciferol, (DRISDOL) 1.25 MG (50000 UNIT) CAPS capsule Take 1 capsule (50,000 Units total) by mouth every 7 (seven) days. 4 capsule 0   No current facility-administered medications on file prior to visit.   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   Allergies  Allergen Reactions   Latex Itching   Cephalexin Rash   Penicillin G Rash   Other    Family History  Problem Relation Age of Onset   Cancer Mother    Arthritis Mother    Hearing loss Mother    Depression Mother    Anxiety disorder Mother    Obesity Mother    Hearing loss Father    Heart disease Father    Hypertension Father    Depression Father    Anxiety disorder Father    Obesity Father    Alcohol abuse Paternal Grandfather    Social History   Socioeconomic History   Marital status: Married    Spouse name: Deborah Underwood    Number of children: 1   Years of education: Not on file   Highest education level: Bachelor's degree (e.g., BA, AB, BS)  Occupational History   Occupation: Risk analyst  Tobacco Use   Smoking status: Former    Current packs/day: 0.00    Average packs/day: 1.5 packs/day for 17.0 years (25.5 ttl pk-yrs)    Types: Cigarettes    Start date: 05/16/1992    Quit date: 05/16/2009    Years since quitting: 13.9   Smokeless tobacco: Never  Vaping Use   Vaping status: Never Used  Substance and Sexual Activity   Alcohol use: Yes   Drug use: Never   Sexual activity: Not Currently    Partners: Male  Other Topics Concern   Not on file  Social History Narrative   Not on file   Social Determinants of Health   Financial Resource Strain: Low Risk  (03/27/2023)   Overall Financial Resource Strain (CARDIA)    Difficulty of Paying Living Expenses: Not hard at all  Food Insecurity: No Food Insecurity (03/27/2023)    Hunger Vital Sign    Worried About Running Out of Food in the Last Year: Never true    Ran Out of Food in the Last Year: Never true  Transportation Needs: No Transportation Needs (03/27/2023)   PRAPARE - Administrator, Civil Service (Medical): No    Lack of Transportation (Non-Medical): No  Physical Activity: Insufficiently Active (03/27/2023)   Exercise Vital Sign    Days of Exercise per Week: 2 days    Minutes of Exercise per Session: 40 min  Stress: Stress Concern Present (03/27/2023)  [employer] of Occupational Health - Occupational Stress Questionnaire    Feeling of Stress : Very much  Social Connections: Unknown (03/27/2023)   Social Connection and Isolation Panel [NHANES]    Frequency of Communication with Friends and Family: Twice a week    Frequency of Social Gatherings with Friends and Family: Once a week    Attends Religious Services: Patient declined    Database administrator or Organizations: No    Attends Engineer, structural: Not on file    Marital Status: Married   Vitals:   04/10/23 1549  BP: 128/70  Pulse: 100  Resp: 12  Temp: 98.6 F (37 C)  SpO2: 98%   Body mass index is 45.32 kg/m.  Wt Readings from Last 3 Encounters:  04/10/23 289 lb 6 oz (131.3 kg)  03/28/23 292 lb (132.5 kg)  10/11/22 287 lb 1.6 oz (130.2 kg)   Physical Exam Vitals and nursing note reviewed.  Constitutional:      General: She is not in acute distress.    Appearance: She is well-developed.  HENT:     Head: Normocephalic and atraumatic.     Right Ear: Tympanic membrane, ear canal and external ear normal.     Left Ear: Tympanic membrane, ear canal and external ear normal.     Mouth/Throat:     Mouth: Mucous membranes are moist.     Pharynx: Oropharynx is clear. Uvula midline.     Comments: Right tonsil is slightly larger than left Eyes:     Extraocular Movements: Extraocular movements intact.     Conjunctiva/sclera: Conjunctivae normal.      Pupils: Pupils are equal, round, and reactive to light.  Neck:     Thyroid: No thyroid mass or thyromegaly.  Cardiovascular:     Rate and Rhythm: Normal rate and regular rhythm.     Pulses:          Dorsalis pedis pulses are 2+ on the right side and 2+ on the left side.     Heart sounds: No murmur heard. Pulmonary:     Effort: Pulmonary effort is normal. No respiratory distress.     Breath sounds: Normal breath sounds.  Abdominal:     Palpations: Abdomen is soft. There is no hepatomegaly or mass.     Tenderness: There is no abdominal tenderness.  Genitourinary:    Comments: Deferred to gyn. Musculoskeletal:     Right lower leg: No edema.     Left lower leg: No edema.     Comments: No major deformity or signs of synovitis appreciated.  Lymphadenopathy:     Cervical: No cervical adenopathy.     Upper Body:     Right upper body: No supraclavicular adenopathy.     Left upper body: No supraclavicular adenopathy.  Skin:    General: Skin is warm.     Findings: No erythema or rash.  Neurological:     General: No focal deficit present.     Mental Status: She is alert and oriented to person, place, and time.     Cranial Nerves: No cranial nerve deficit.     Coordination: Coordination normal.     Gait: Gait normal.     Deep Tendon Reflexes:     Reflex Scores:      Bicep reflexes are 2+ on the right side and 2+ on the left side.      Patellar reflexes are 2+ on the right side and 2+ on the left side. Psychiatric:  Mood and Affect: Mood and affect normal.   ASSESSMENT AND PLAN:  Ms. Deborah Underwood was here today for her annual physical examination.  Orders Placed This Encounter  Procedures   Tdap vaccine greater than or equal to 7yo IM   Cologuard   Routine general medical examination at a health care facility Assessment & Plan: We discussed the importance of regular physical activity and healthy diet for prevention of chronic illness and/or complications. Preventive  guidelines reviewed. Vaccination up to date, prefers to hold on Shingrix, declined flu vaccine. Tdap given today. Continue her female preventive care with her gyn. Lung cancer screening: States that her health insurance does not cover it.  Next CPE in a year.  Other hyperlipidemia Assessment & Plan: In general numbers have improved. Continue low fat diet. F/U in 12 months.  Colon cancer screening -     Cologuard  Need for Tdap vaccination -     Tdap vaccine greater than or equal to 7yo IM  Class 3 severe obesity with serious comorbidity and body mass index (BMI) of 40.0 to 44.9 in adult, unspecified obesity type [facility]) Assessment & Plan: Since her last visit, earlier this month, she has made some changes and lost some wt. Patient understands the benefits of wt loss as well as adverse effects of obesity. Consistency with healthy diet and physical activity encouraged.  Prediabetes Assessment & Plan: HgA1C today 6.1. Consistency with a healthy life style encouraged for diabetes prevention.  Cough, persistent No new symptoms since last seen on 03/28/23 and seems to be slowly improving. CXR done today: ? Left parahiliar density. We discussed imaging, discussed options, including abx trial, we decided to wait on it. Hx of tobacco use, so lung cancer screening is indicated, her health insurance does not cover it.  ALT has been mildly elevated intermittently, mild and otherwise stable. We will continue following regularly.  Return in 1 year (on 04/09/2024) for CPE.  I, Rolla Etienne Wierda, acting as a scribe for Agustine Rossitto [place], MD., have documented all relevant documentation on the behalf of Deborah Hodder [place], MD, as directed by  Navya Timmons [place], MD while in the presence of Onofrio Klemp [place], MD.   I, Breland Elders [place], MD, have reviewed all documentation for this visit. The documentation on 04/10/23 for the exam, diagnosis, procedures, and orders are all accurate and complete.  Terique Kawabata G. [place],  MD  [facility]. [place] office.

## 2023-04-10 NOTE — Assessment & Plan Note (Signed)
Since her last visit, earlier this month, she has made some changes and lost some wt. Patient understands the benefits of wt loss as well as adverse effects of obesity. Consistency with healthy diet and physical activity encouraged.

## 2023-04-10 NOTE — Assessment & Plan Note (Signed)
In general numbers have improved. Continue low fat diet. F/U in 12 months.

## 2023-04-10 NOTE — Assessment & Plan Note (Signed)
We discussed the importance of regular physical activity and healthy diet for prevention of chronic illness and/or complications. Preventive guidelines reviewed. Vaccination up to date, prefers to hold on Shingrix, declined flu vaccine. Tdap given today. Continue her female preventive care with her gyn. Lung cancer screening: States that her health insurance does not cover it.  Next CPE in a year.

## 2023-04-10 NOTE — Assessment & Plan Note (Signed)
HgA1C today 6.1. Consistency with a healthy life style encouraged for diabetes prevention.

## 2023-04-10 NOTE — Patient Instructions (Addendum)
A few things to remember from today's visit:  Routine general medical examination at a health care facility  Other hyperlipidemia  Colon cancer screening - Plan: Cologuard  Screening for endocrine, metabolic and immunity disorder  Do not use My Chart to request refills or for acute issues that need immediate attention. If you send a my chart message, it may take a few days to be addressed, specially if I am not in the office.  Please be sure medication list is accurate. If a new problem present, please set up appointment sooner than planned today.  Health Maintenance, Female Adopting a healthy lifestyle and getting preventive care are important in promoting health and wellness. Ask your health care provider about: The right schedule for you to have regular tests and exams. Things you can do on your own to prevent diseases and keep yourself healthy. What should I know about diet, weight, and exercise? Eat a healthy diet  Eat a diet that includes plenty of vegetables, fruits, low-fat dairy products, and lean protein. Do not eat a lot of foods that are high in solid fats, added sugars, or sodium. Maintain a healthy weight Body mass index (BMI) is used to identify weight problems. It estimates body fat based on height and weight. Your health care provider can help determine your BMI and help you achieve or maintain a healthy weight. Get regular exercise Get regular exercise. This is one of the most important things you can do for your health. Most adults should: Exercise for at least 150 minutes each week. The exercise should increase your heart rate and make you sweat (moderate-intensity exercise). Do strengthening exercises at least twice a week. This is in addition to the moderate-intensity exercise. Spend less time sitting. Even light physical activity can be beneficial. Watch cholesterol and blood lipids Have your blood tested for lipids and cholesterol at 52 years of age, then have  this test every 5 years. Have your cholesterol levels checked more often if: Your lipid or cholesterol levels are high. You are older than 52 years of age. You are at high risk for heart disease. What should I know about cancer screening? Depending on your health history and family history, you may need to have cancer screening at various ages. This may include screening for: Breast cancer. Cervical cancer. Colorectal cancer. Skin cancer. Lung cancer. What should I know about heart disease, diabetes, and high blood pressure? Blood pressure and heart disease High blood pressure causes heart disease and increases the risk of stroke. This is more likely to develop in people who have high blood pressure readings or are overweight. Have your blood pressure checked: Every 3-5 years if you are 100-13 years of age. Every year if you are 31 years old or older. Diabetes Have regular diabetes screenings. This checks your fasting blood sugar level. Have the screening done: Once every three years after age 21 if you are at a normal weight and have a low risk for diabetes. More often and at a younger age if you are overweight or have a high risk for diabetes. What should I know about preventing infection? Hepatitis B If you have a higher risk for hepatitis B, you should be screened for this virus. Talk with your health care provider to find out if you are at risk for hepatitis B infection. Hepatitis C Testing is recommended for: Everyone born from [birth year] through 1965. Anyone with known risk factors for hepatitis C. Sexually transmitted infections (STIs) Get screened for STIs,  including gonorrhea and chlamydia, if: You are sexually active and are younger than 52 years of age. You are older than 52 years of age and your health care provider tells you that you are at risk for this type of infection. Your sexual activity has changed since you were last screened, and you are at increased risk for  chlamydia or gonorrhea. Ask your health care provider if you are at risk. Ask your health care provider about whether you are at high risk for HIV. Your health care provider may recommend a prescription medicine to help prevent HIV infection. If you choose to take medicine to prevent HIV, you should first get tested for HIV. You should then be tested every 3 months for as long as you are taking the medicine. Pregnancy If you are about to stop having your period (premenopausal) and you may become pregnant, seek counseling before you get pregnant. Take 400 to 800 micrograms (mcg) of folic acid every day if you become pregnant. Ask for birth control (contraception) if you want to prevent pregnancy. Osteoporosis and menopause Osteoporosis is a disease in which the bones lose minerals and strength with aging. This can result in bone fractures. If you are 32 years old or older, or if you are at risk for osteoporosis and fractures, ask your health care provider if you should: Be screened for bone loss. Take a calcium or vitamin D supplement to lower your risk of fractures. Be given hormone replacement therapy (HRT) to treat symptoms of menopause. Follow these instructions at home: Alcohol use Do not drink alcohol if: Your health care provider tells you not to drink. You are pregnant, may be pregnant, or are planning to become pregnant. If you drink alcohol: Limit how much you have to: 0-1 drink a day. Know how much alcohol is in your drink. In the [place] one drink equals one 12 oz bottle of beer (355 mL), one 5 oz glass of wine (148 mL), or one 1 oz glass of hard liquor (44 mL). Lifestyle Do not use any products that contain nicotine or tobacco. These products include cigarettes, chewing tobacco, and vaping devices, such as e-cigarettes. If you need help quitting, ask your health care provider. Do not use street drugs. Do not share needles. Ask your health care provider for help if you need support  or information about quitting drugs. General instructions Schedule regular health, dental, and eye exams. Stay current with your vaccines. Tell your health care provider if: You often feel depressed. You have ever been abused or do not feel safe at home. Summary Adopting a healthy lifestyle and getting preventive care are important in promoting health and wellness. Follow your health care provider's instructions about healthy diet, exercising, and getting tested or screened for diseases. Follow your health care provider's instructions on monitoring your cholesterol and blood pressure. This information is not intended to replace advice given to you by your health care provider. Make sure you discuss any questions you have with your health care provider. Document Revised: 09/21/2020 Document Reviewed: 09/21/2020 Elsevier Patient Education  2024 [employer].

## 2023-04-20 ENCOUNTER — Encounter: Payer: Self-pay | Admitting: Family Medicine

## 2023-05-09 LAB — COLOGUARD: COLOGUARD: NEGATIVE

## 2023-08-24 DIAGNOSIS — F4323 Adjustment disorder with mixed anxiety and depressed mood: Secondary | ICD-10-CM | POA: Diagnosis not present

## 2023-09-04 DIAGNOSIS — F4323 Adjustment disorder with mixed anxiety and depressed mood: Secondary | ICD-10-CM | POA: Diagnosis not present

## 2023-09-11 DIAGNOSIS — F4323 Adjustment disorder with mixed anxiety and depressed mood: Secondary | ICD-10-CM | POA: Diagnosis not present

## 2023-09-19 DIAGNOSIS — F4323 Adjustment disorder with mixed anxiety and depressed mood: Secondary | ICD-10-CM | POA: Diagnosis not present

## 2023-10-03 DIAGNOSIS — F4323 Adjustment disorder with mixed anxiety and depressed mood: Secondary | ICD-10-CM | POA: Diagnosis not present

## 2023-10-11 DIAGNOSIS — F4323 Adjustment disorder with mixed anxiety and depressed mood: Secondary | ICD-10-CM | POA: Diagnosis not present

## 2023-10-18 DIAGNOSIS — F4322 Adjustment disorder with anxiety: Secondary | ICD-10-CM | POA: Diagnosis not present

## 2023-10-19 DIAGNOSIS — F4323 Adjustment disorder with mixed anxiety and depressed mood: Secondary | ICD-10-CM | POA: Diagnosis not present

## 2023-10-23 DIAGNOSIS — F4323 Adjustment disorder with mixed anxiety and depressed mood: Secondary | ICD-10-CM | POA: Diagnosis not present

## 2023-10-25 DIAGNOSIS — F411 Generalized anxiety disorder: Secondary | ICD-10-CM | POA: Diagnosis not present

## 2023-10-26 DIAGNOSIS — M9901 Segmental and somatic dysfunction of cervical region: Secondary | ICD-10-CM | POA: Diagnosis not present

## 2023-10-26 DIAGNOSIS — M9902 Segmental and somatic dysfunction of thoracic region: Secondary | ICD-10-CM | POA: Diagnosis not present

## 2023-10-26 DIAGNOSIS — M9903 Segmental and somatic dysfunction of lumbar region: Secondary | ICD-10-CM | POA: Diagnosis not present

## 2023-10-26 DIAGNOSIS — M542 Cervicalgia: Secondary | ICD-10-CM | POA: Diagnosis not present

## 2023-10-31 DIAGNOSIS — F4323 Adjustment disorder with mixed anxiety and depressed mood: Secondary | ICD-10-CM | POA: Diagnosis not present

## 2023-11-01 DIAGNOSIS — M9901 Segmental and somatic dysfunction of cervical region: Secondary | ICD-10-CM | POA: Diagnosis not present

## 2023-11-01 DIAGNOSIS — M9903 Segmental and somatic dysfunction of lumbar region: Secondary | ICD-10-CM | POA: Diagnosis not present

## 2023-11-01 DIAGNOSIS — M542 Cervicalgia: Secondary | ICD-10-CM | POA: Diagnosis not present

## 2023-11-01 DIAGNOSIS — M9902 Segmental and somatic dysfunction of thoracic region: Secondary | ICD-10-CM | POA: Diagnosis not present

## 2023-11-02 DIAGNOSIS — M542 Cervicalgia: Secondary | ICD-10-CM | POA: Diagnosis not present

## 2023-11-02 DIAGNOSIS — M9901 Segmental and somatic dysfunction of cervical region: Secondary | ICD-10-CM | POA: Diagnosis not present

## 2023-11-02 DIAGNOSIS — M9902 Segmental and somatic dysfunction of thoracic region: Secondary | ICD-10-CM | POA: Diagnosis not present

## 2023-11-02 DIAGNOSIS — M9903 Segmental and somatic dysfunction of lumbar region: Secondary | ICD-10-CM | POA: Diagnosis not present

## 2023-11-06 DIAGNOSIS — M9902 Segmental and somatic dysfunction of thoracic region: Secondary | ICD-10-CM | POA: Diagnosis not present

## 2023-11-06 DIAGNOSIS — M9903 Segmental and somatic dysfunction of lumbar region: Secondary | ICD-10-CM | POA: Diagnosis not present

## 2023-11-06 DIAGNOSIS — M542 Cervicalgia: Secondary | ICD-10-CM | POA: Diagnosis not present

## 2023-11-06 DIAGNOSIS — M9901 Segmental and somatic dysfunction of cervical region: Secondary | ICD-10-CM | POA: Diagnosis not present

## 2023-11-08 DIAGNOSIS — M9901 Segmental and somatic dysfunction of cervical region: Secondary | ICD-10-CM | POA: Diagnosis not present

## 2023-11-08 DIAGNOSIS — M9902 Segmental and somatic dysfunction of thoracic region: Secondary | ICD-10-CM | POA: Diagnosis not present

## 2023-11-08 DIAGNOSIS — M9903 Segmental and somatic dysfunction of lumbar region: Secondary | ICD-10-CM | POA: Diagnosis not present

## 2023-11-08 DIAGNOSIS — M542 Cervicalgia: Secondary | ICD-10-CM | POA: Diagnosis not present

## 2023-11-13 DIAGNOSIS — M9901 Segmental and somatic dysfunction of cervical region: Secondary | ICD-10-CM | POA: Diagnosis not present

## 2023-11-13 DIAGNOSIS — M9902 Segmental and somatic dysfunction of thoracic region: Secondary | ICD-10-CM | POA: Diagnosis not present

## 2023-11-13 DIAGNOSIS — M9903 Segmental and somatic dysfunction of lumbar region: Secondary | ICD-10-CM | POA: Diagnosis not present

## 2023-11-13 DIAGNOSIS — M542 Cervicalgia: Secondary | ICD-10-CM | POA: Diagnosis not present

## 2023-11-14 DIAGNOSIS — F4323 Adjustment disorder with mixed anxiety and depressed mood: Secondary | ICD-10-CM | POA: Diagnosis not present

## 2023-11-15 DIAGNOSIS — M9901 Segmental and somatic dysfunction of cervical region: Secondary | ICD-10-CM | POA: Diagnosis not present

## 2023-11-15 DIAGNOSIS — M9902 Segmental and somatic dysfunction of thoracic region: Secondary | ICD-10-CM | POA: Diagnosis not present

## 2023-11-15 DIAGNOSIS — M542 Cervicalgia: Secondary | ICD-10-CM | POA: Diagnosis not present

## 2023-11-15 DIAGNOSIS — M9903 Segmental and somatic dysfunction of lumbar region: Secondary | ICD-10-CM | POA: Diagnosis not present

## 2023-11-15 IMAGING — MG MM DIGITAL DIAGNOSTIC UNILAT*L* W/ TOMO W/ CAD
6 series · 6 of 18 positions shown · non-contrast
Comparison: Previous exam(s).

CLINICAL DATA: Patient recalled from screening for left breast
asymmetry.

EXAM:
DIGITAL DIAGNOSTIC UNILATERAL LEFT MAMMOGRAM WITH TOMOSYNTHESIS AND
CAD
TECHNIQUE: Left digital diagnostic mammography and breast tomosynthesis was
performed. The images were evaluated with computer-aided detection.

[L ML synth-2D]
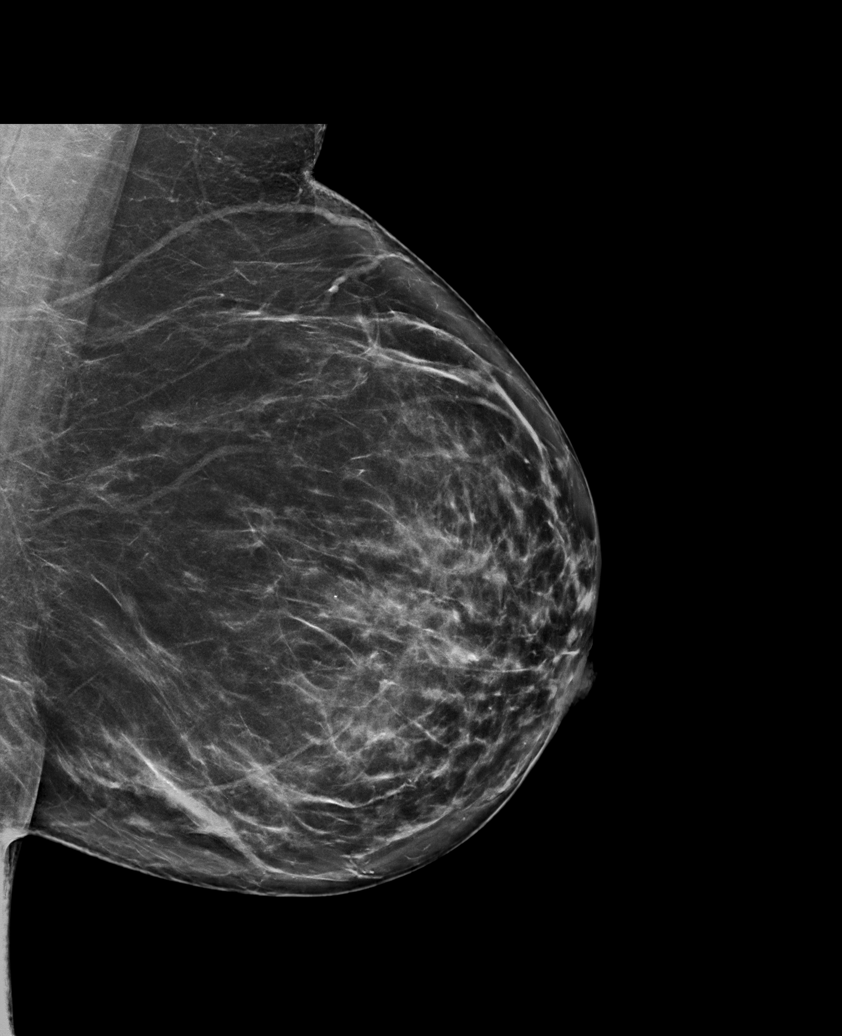

[L MLO synth-2D (1 of 2)]
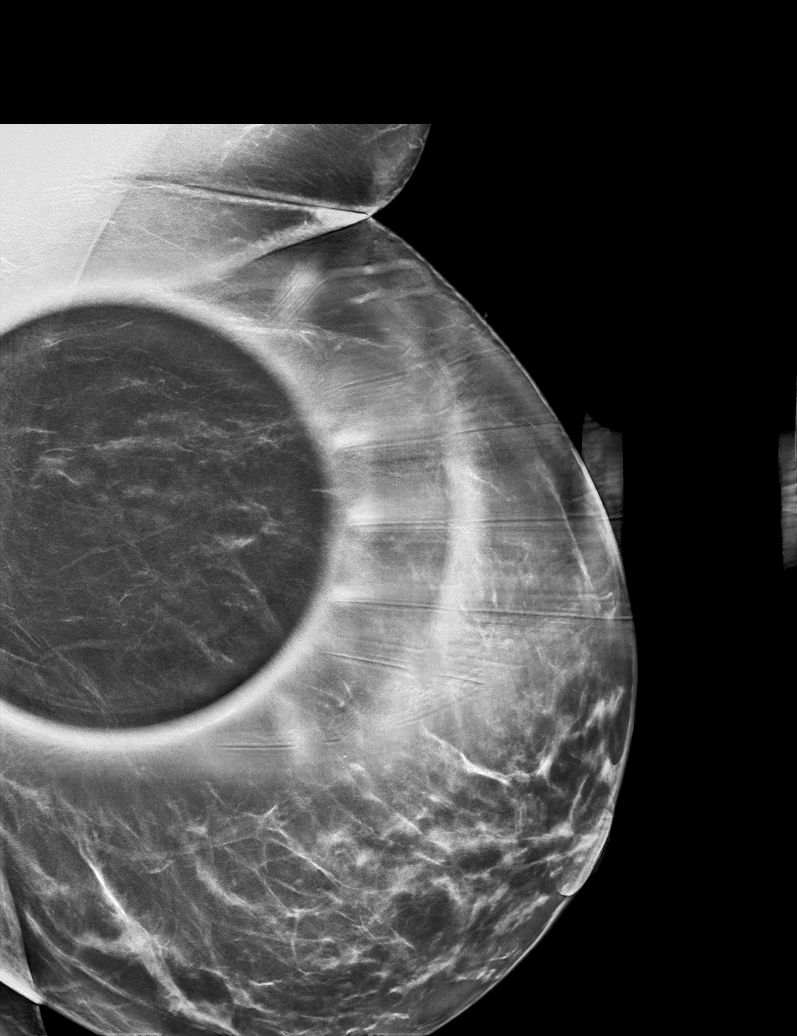

[L MLO synth-2D (2 of 2)]
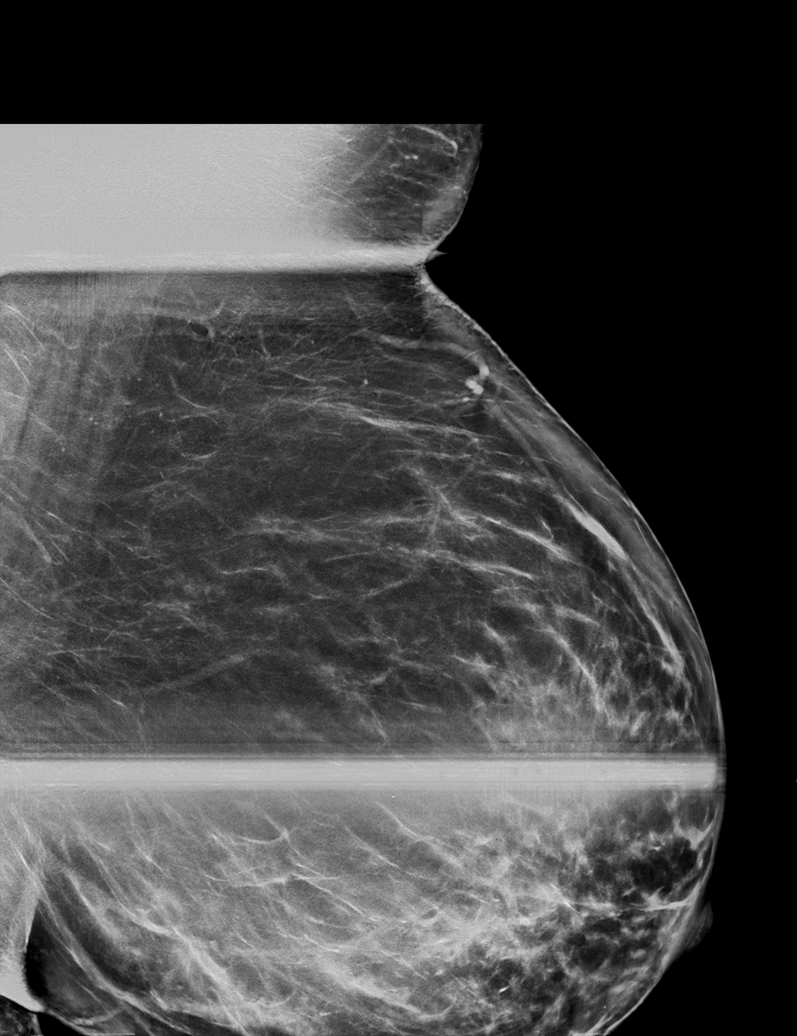

[L MLO tomo (1 of 2) · tomo slice 42/83.0]
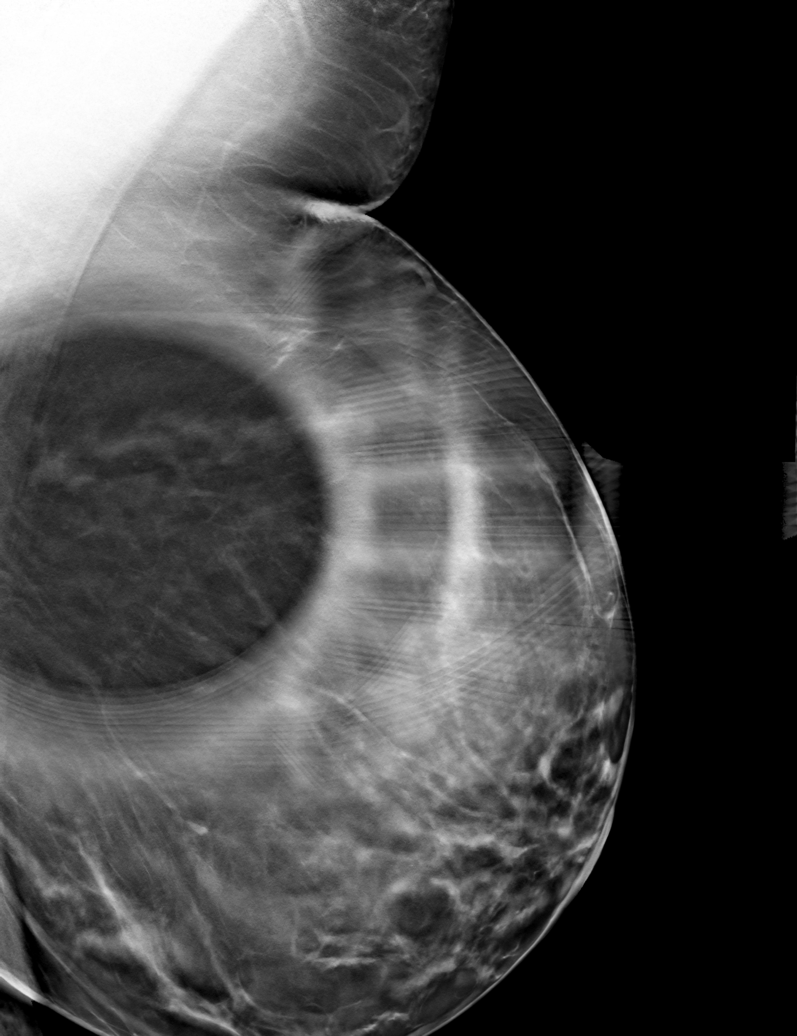

[L MLO tomo (2 of 2) · tomo slice 43/86.0]
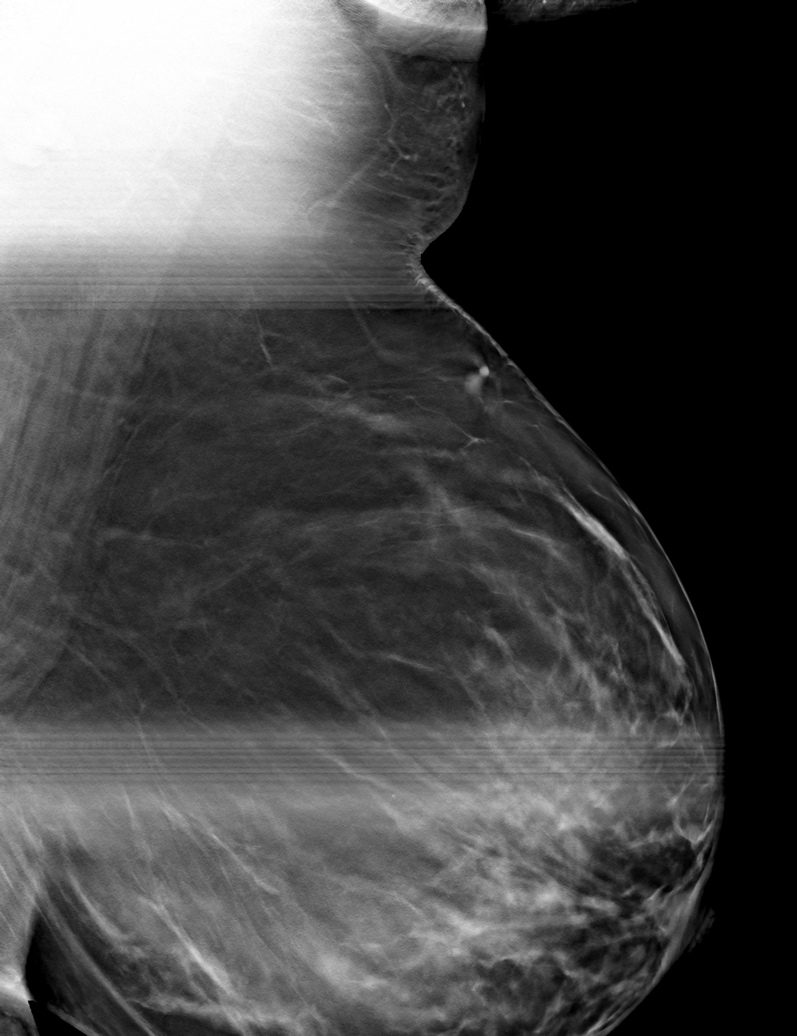

[L ML tomo · tomo slice 44/87.0]
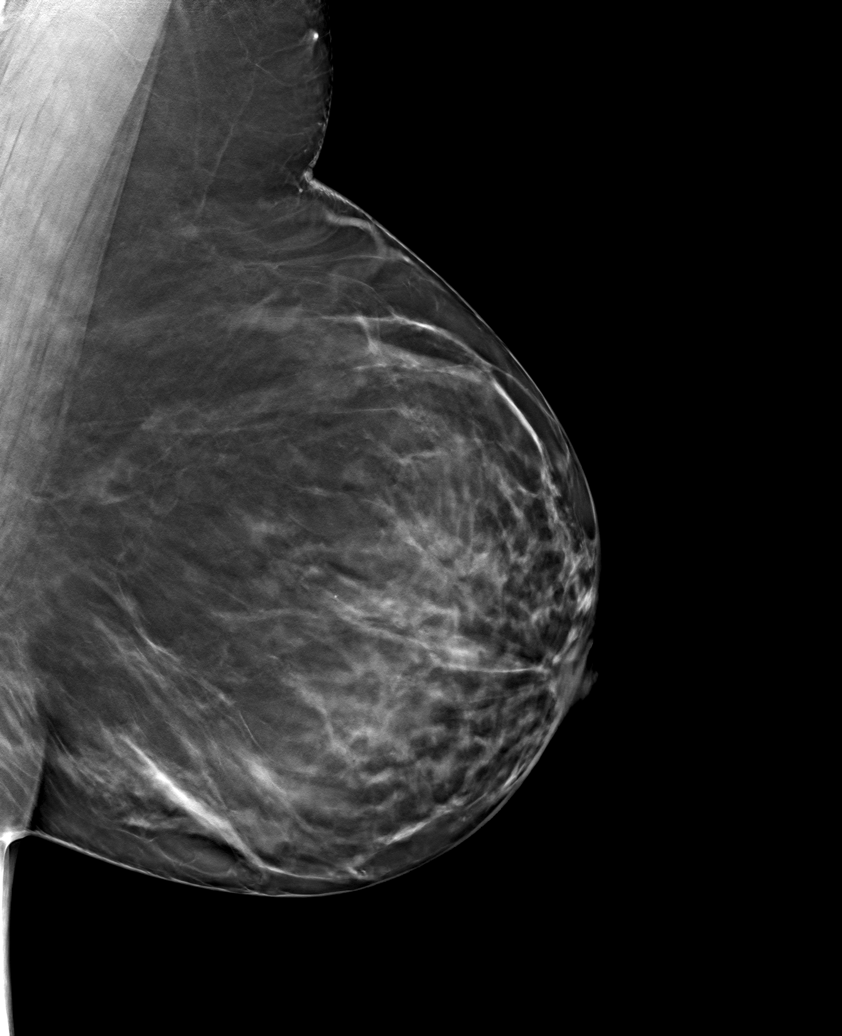

[6 of 18 positions shown; findings below may reference images not displayed]

ACR Breast Density Category b: There are scattered areas of
fibroglandular density.
FINDINGS: Questioned asymmetry within the left breast resolved with additional
imaging compatible with dense overlapping fibroglandular tissue. No
suspicious findings on additional imaging.
IMPRESSION: No mammographic evidence for malignancy.

RECOMMENDATION:
Screening mammogram in one year.(Code:9H-[id number])

I have discussed the findings and recommendations with the patient.
If applicable, a reminder letter will be sent to the patient
regarding the next appointment.

BI-RADS CATEGORY  1: Negative.

## 2023-11-20 DIAGNOSIS — M9903 Segmental and somatic dysfunction of lumbar region: Secondary | ICD-10-CM | POA: Diagnosis not present

## 2023-11-20 DIAGNOSIS — M9902 Segmental and somatic dysfunction of thoracic region: Secondary | ICD-10-CM | POA: Diagnosis not present

## 2023-11-20 DIAGNOSIS — M542 Cervicalgia: Secondary | ICD-10-CM | POA: Diagnosis not present

## 2023-11-20 DIAGNOSIS — M9901 Segmental and somatic dysfunction of cervical region: Secondary | ICD-10-CM | POA: Diagnosis not present

## 2023-11-22 DIAGNOSIS — M542 Cervicalgia: Secondary | ICD-10-CM | POA: Diagnosis not present

## 2023-11-22 DIAGNOSIS — M9901 Segmental and somatic dysfunction of cervical region: Secondary | ICD-10-CM | POA: Diagnosis not present

## 2023-11-22 DIAGNOSIS — M9902 Segmental and somatic dysfunction of thoracic region: Secondary | ICD-10-CM | POA: Diagnosis not present

## 2023-11-22 DIAGNOSIS — M9903 Segmental and somatic dysfunction of lumbar region: Secondary | ICD-10-CM | POA: Diagnosis not present

## 2023-11-23 DIAGNOSIS — M9901 Segmental and somatic dysfunction of cervical region: Secondary | ICD-10-CM | POA: Diagnosis not present

## 2023-11-23 DIAGNOSIS — M9902 Segmental and somatic dysfunction of thoracic region: Secondary | ICD-10-CM | POA: Diagnosis not present

## 2023-11-23 DIAGNOSIS — M9903 Segmental and somatic dysfunction of lumbar region: Secondary | ICD-10-CM | POA: Diagnosis not present

## 2023-11-23 DIAGNOSIS — M542 Cervicalgia: Secondary | ICD-10-CM | POA: Diagnosis not present

## 2023-11-27 DIAGNOSIS — M542 Cervicalgia: Secondary | ICD-10-CM | POA: Diagnosis not present

## 2023-11-27 DIAGNOSIS — M9902 Segmental and somatic dysfunction of thoracic region: Secondary | ICD-10-CM | POA: Diagnosis not present

## 2023-11-27 DIAGNOSIS — M9903 Segmental and somatic dysfunction of lumbar region: Secondary | ICD-10-CM | POA: Diagnosis not present

## 2023-11-27 DIAGNOSIS — M9901 Segmental and somatic dysfunction of cervical region: Secondary | ICD-10-CM | POA: Diagnosis not present

## 2023-11-29 DIAGNOSIS — M542 Cervicalgia: Secondary | ICD-10-CM | POA: Diagnosis not present

## 2023-11-29 DIAGNOSIS — M9903 Segmental and somatic dysfunction of lumbar region: Secondary | ICD-10-CM | POA: Diagnosis not present

## 2023-11-29 DIAGNOSIS — M9902 Segmental and somatic dysfunction of thoracic region: Secondary | ICD-10-CM | POA: Diagnosis not present

## 2023-11-29 DIAGNOSIS — M9901 Segmental and somatic dysfunction of cervical region: Secondary | ICD-10-CM | POA: Diagnosis not present

## 2023-11-30 DIAGNOSIS — M9901 Segmental and somatic dysfunction of cervical region: Secondary | ICD-10-CM | POA: Diagnosis not present

## 2023-11-30 DIAGNOSIS — M9903 Segmental and somatic dysfunction of lumbar region: Secondary | ICD-10-CM | POA: Diagnosis not present

## 2023-11-30 DIAGNOSIS — F4323 Adjustment disorder with mixed anxiety and depressed mood: Secondary | ICD-10-CM | POA: Diagnosis not present

## 2023-11-30 DIAGNOSIS — M9902 Segmental and somatic dysfunction of thoracic region: Secondary | ICD-10-CM | POA: Diagnosis not present

## 2023-11-30 DIAGNOSIS — M542 Cervicalgia: Secondary | ICD-10-CM | POA: Diagnosis not present

## 2023-12-04 DIAGNOSIS — M9903 Segmental and somatic dysfunction of lumbar region: Secondary | ICD-10-CM | POA: Diagnosis not present

## 2023-12-04 DIAGNOSIS — M542 Cervicalgia: Secondary | ICD-10-CM | POA: Diagnosis not present

## 2023-12-04 DIAGNOSIS — M9901 Segmental and somatic dysfunction of cervical region: Secondary | ICD-10-CM | POA: Diagnosis not present

## 2023-12-04 DIAGNOSIS — M9902 Segmental and somatic dysfunction of thoracic region: Secondary | ICD-10-CM | POA: Diagnosis not present

## 2023-12-06 DIAGNOSIS — M9903 Segmental and somatic dysfunction of lumbar region: Secondary | ICD-10-CM | POA: Diagnosis not present

## 2023-12-06 DIAGNOSIS — M542 Cervicalgia: Secondary | ICD-10-CM | POA: Diagnosis not present

## 2023-12-06 DIAGNOSIS — M9901 Segmental and somatic dysfunction of cervical region: Secondary | ICD-10-CM | POA: Diagnosis not present

## 2023-12-06 DIAGNOSIS — M9902 Segmental and somatic dysfunction of thoracic region: Secondary | ICD-10-CM | POA: Diagnosis not present

## 2023-12-11 DIAGNOSIS — M542 Cervicalgia: Secondary | ICD-10-CM | POA: Diagnosis not present

## 2023-12-11 DIAGNOSIS — M9901 Segmental and somatic dysfunction of cervical region: Secondary | ICD-10-CM | POA: Diagnosis not present

## 2023-12-11 DIAGNOSIS — M9902 Segmental and somatic dysfunction of thoracic region: Secondary | ICD-10-CM | POA: Diagnosis not present

## 2023-12-11 DIAGNOSIS — M9903 Segmental and somatic dysfunction of lumbar region: Secondary | ICD-10-CM | POA: Diagnosis not present

## 2023-12-13 DIAGNOSIS — M9903 Segmental and somatic dysfunction of lumbar region: Secondary | ICD-10-CM | POA: Diagnosis not present

## 2023-12-13 DIAGNOSIS — M542 Cervicalgia: Secondary | ICD-10-CM | POA: Diagnosis not present

## 2023-12-13 DIAGNOSIS — M9901 Segmental and somatic dysfunction of cervical region: Secondary | ICD-10-CM | POA: Diagnosis not present

## 2023-12-13 DIAGNOSIS — M9902 Segmental and somatic dysfunction of thoracic region: Secondary | ICD-10-CM | POA: Diagnosis not present

## 2023-12-15 ENCOUNTER — Encounter: Payer: Self-pay | Admitting: Family Medicine

## 2023-12-15 ENCOUNTER — Ambulatory Visit (INDEPENDENT_AMBULATORY_CARE_PROVIDER_SITE_OTHER): Payer: Self-pay | Admitting: Family Medicine

## 2023-12-15 VITALS — BP 128/80 | HR 72 | Temp 98.3°F | Resp 12 | Ht 67.0 in | Wt 295.0 lb

## 2023-12-15 DIAGNOSIS — R7401 Elevation of levels of liver transaminase levels: Secondary | ICD-10-CM | POA: Diagnosis not present

## 2023-12-15 DIAGNOSIS — E559 Vitamin D deficiency, unspecified: Secondary | ICD-10-CM | POA: Diagnosis not present

## 2023-12-15 DIAGNOSIS — R2231 Localized swelling, mass and lump, right upper limb: Secondary | ICD-10-CM

## 2023-12-15 DIAGNOSIS — R7303 Prediabetes: Secondary | ICD-10-CM

## 2023-12-15 DIAGNOSIS — D509 Iron deficiency anemia, unspecified: Secondary | ICD-10-CM | POA: Diagnosis not present

## 2023-12-15 DIAGNOSIS — Z0189 Encounter for other specified special examinations: Secondary | ICD-10-CM | POA: Diagnosis not present

## 2023-12-15 DIAGNOSIS — Z1159 Encounter for screening for other viral diseases: Secondary | ICD-10-CM

## 2023-12-15 DIAGNOSIS — Z122 Encounter for screening for malignant neoplasm of respiratory organs: Secondary | ICD-10-CM

## 2023-12-15 LAB — BASIC METABOLIC PANEL WITH GFR
BUN: 16 mg/dL (ref 6–23)
CO2: 26 meq/L (ref 19–32)
Calcium: 9.3 mg/dL (ref 8.4–10.5)
Chloride: 103 meq/L (ref 96–112)
Creatinine, Ser: 0.9 mg/dL (ref 0.40–1.20)
GFR: 73.13 mL/min (ref >60.00)
Glucose, Bld: 92 mg/dL (ref 70–99)
Potassium: 4.2 meq/L (ref 3.5–5.1)
Sodium: 138 meq/L (ref 135–145)

## 2023-12-15 LAB — CBC
HCT: 42.7 % (ref 36.0–46.0)
Hemoglobin: 13.9 g/dL (ref 12.0–15.0)
MCHC: 32.5 g/dL (ref 30.0–36.0)
MCV: 79.4 fl (ref 78.0–100.0)
Platelets: 340 K/uL (ref 150.0–400.0)
RBC: 5.38 Mil/uL — ABNORMAL HIGH (ref 3.87–5.11)
RDW: 16.4 % — ABNORMAL HIGH (ref 11.5–15.5)
WBC: 7.3 K/uL (ref 4.0–10.5)

## 2023-12-15 LAB — HEPATIC FUNCTION PANEL
ALT: 21 U/L (ref 0–35)
AST: 14 U/L (ref 0–37)
Albumin: 4.1 g/dL (ref 3.5–5.2)
Alkaline Phosphatase: 90 U/L (ref 39–117)
Bilirubin, Direct: 0.1 mg/dL (ref 0.0–0.3)
Total Bilirubin: 0.5 mg/dL (ref 0.2–1.2)
Total Protein: 7.2 g/dL (ref 6.0–8.3)

## 2023-12-15 LAB — HEMOGLOBIN A1C: Hgb A1c MFr Bld: 6.1 % (ref 4.6–6.5)

## 2023-12-15 LAB — FERRITIN: Ferritin: 22.2 ng/mL (ref 10.0–291.0)

## 2023-12-15 LAB — VITAMIN D 25 HYDROXY (VIT D DEFICIENCY, FRACTURES): VITD: 48.91 ng/mL (ref 30.00–100.00)

## 2023-12-15 LAB — IRON: Iron: 48 ug/dL (ref 42–145)

## 2023-12-15 LAB — C-REACTIVE PROTEIN: CRP: 1.1 mg/dL (ref 0.5–20.0)

## 2023-12-15 NOTE — Progress Notes (Signed)
 ACUTE VISIT Chief Complaint  Patient presents with   Mass    Right forearm, has had for a while, but is bigger now.    HPI: [name] is a 53 y.o. female PMHx significant for prediabetes, depression, HLD, and vitamin D  deficiency who is here today complaining of lump on right forearm. She first noticed this mass several years ago and at the time had it evaluated by an orthopedist.  She was told it was not concerning, given it was  small, soft, and mobile at the time.  Slow growth. She is concerned about a more serious process like cancer.  She recently noticed her right upper extremity is larger than her left upper extremity, 2-3 months ago. No edema, erythema,or pain. Pt it right-handed.   Of note, she has also been experiencing right shoulder pain x1 year. She started seeing a chiropractor in early 10/2023, which did help with her shoulder pain.   She also reports having dental issues including a molar with a broken root.   She has hx of iron  def anemia and vitamin D  supplementation irregularly.  She would like to have blood work today.  Lab Results  Component Value Date   WBC 6.8 07/14/2021   HGB 15.3 07/14/2021   HCT 46.1 07/14/2021   MCV 83 07/14/2021   PLT 403 07/14/2021   Lab Results  Component Value Date   VD25OH 43.9 12/02/2021   Her ALT has been mildly elevated. Negative for abdominal pain or jaundice.  Lab Results  Component Value Date   ALT 37 (H) 04/10/2023   AST 23 04/10/2023   ALKPHOS 84 04/10/2023   BILITOT 0.5 04/10/2023   Lab Results  Component Value Date   NA 138 04/10/2023   CL 102 04/10/2023   K 3.7 04/10/2023   CO2 27 04/10/2023   BUN 15 04/10/2023   CREATININE 0.98 04/10/2023   GFR 66.34 04/10/2023   CALCIUM 9.1 04/10/2023   ALBUMIN 4.0 04/10/2023   GLUCOSE 94 04/10/2023   Prediabetes: Negative for polydipsia,polyuria, or polyphagia.  Lab Results  Component Value Date   HGBA1C 6.1 04/10/2023   Former smoker. She  has a new health insurance that may covered lung cancer screening.  Review of Systems  Constitutional:  Negative for activity change, appetite change and fever.  HENT:  Negative for sore throat and trouble swallowing.   Respiratory:  Negative for cough, shortness of breath and wheezing.   Cardiovascular:  Negative for chest pain and leg swelling.  Gastrointestinal:  Negative for abdominal pain, nausea and vomiting.  Endocrine: Negative for cold intolerance and heat intolerance.  Genitourinary:  Negative for decreased urine volume, dysuria and hematuria.  Skin:  Negative for rash.  Neurological:  Negative for syncope, weakness and numbness.  See other pertinent positives and negatives in HPI.  Current Outpatient Medications on File Prior to Visit  Medication Sig Dispense Refill   Ferrous Sulfate (IRON ) 325 (65 Fe) MG TABS Take 1 tablet (325 mg total) by mouth daily. 30 tablet 0   Vitamin D , Ergocalciferol , (DRISDOL ) 1.25 MG (50000 UNIT) CAPS capsule Take 1 capsule (50,000 Units total) by mouth every 7 (seven) days. 4 capsule 0   No current facility-administered medications on file prior to visit.   Past Medical History:  Diagnosis Date   Anxiety    Back pain    Depression    Edema of both lower extremities    Fatigue    Fatty liver    Heavy periods  Joint pain    Lower extremity edema    Menopause    PCOS (polycystic ovarian syndrome)    Prediabetes    Shoulder pain    SOB (shortness of breath) on exertion    Vitamin B12 deficiency    Vitamin D  deficiency    Allergies  Allergen Reactions   Latex Itching   Cephalexin Rash   Penicillin G Rash   Other     Social History   Socioeconomic History   Marital status: Married    Spouse name: Gustavo    Number of children: 1   Years of education: Not on file   Highest education level: Bachelor's degree (e.g., BA, AB, BS)  Occupational History   Occupation: Risk analyst  Tobacco Use   Smoking status: Former     Current packs/day: 0.00    Average packs/day: 1.5 packs/day for 17.0 years (25.5 ttl pk-yrs)    Types: Cigarettes    Start date: 05/16/1992    Quit date: 05/16/2009    Years since quitting: 14.5   Smokeless tobacco: Never  Vaping Use   Vaping status: Never Used  Substance and Sexual Activity   Alcohol use: Yes   Drug use: Never   Sexual activity: Not Currently    Partners: Male  Other Topics Concern   Not on file  Social History Narrative   Not on file   Social Drivers of Health   Financial Resource Strain: Low Risk  (12/14/2023)   Overall Financial Resource Strain (CARDIA)    Difficulty of Paying Living Expenses: Not hard at all  Food Insecurity: No Food Insecurity (12/14/2023)   Hunger Vital Sign    Worried About Running Out of Food in the Last Year: Never true    Ran Out of Food in the Last Year: Never true  Transportation Needs: No Transportation Needs (12/14/2023)   PRAPARE - Administrator, Civil Service (Medical): No    Lack of Transportation (Non-Medical): No  Physical Activity: Insufficiently Active (12/14/2023)   Exercise Vital Sign    Days of Exercise per Week: 1 day    Minutes of Exercise per Session: 20 min  Stress: No Stress Concern Present (12/14/2023)   [employer] of Occupational Health - Occupational Stress Questionnaire    Feeling of Stress: Not at all  Social Connections: Socially Integrated (12/14/2023)   Social Connection and Isolation Panel    Frequency of Communication with Friends and Family: Twice a week    Frequency of Social Gatherings with Friends and Family: Once a week    Attends Religious Services: More than 4 times per year    Active Member of [employer] or Organizations: Yes    Attends Banker Meetings: More than 4 times per year    Marital Status: Married    Vitals:   12/15/23 1409  BP: 128/80  Pulse: 72  Resp: 12  Temp: 98.3 F (36.8 C)  SpO2: 99%   Wt Readings from Last 3 Encounters:  12/15/23 295 lb (133.8  kg)  04/10/23 289 lb 6 oz (131.3 kg)  03/28/23 292 lb (132.5 kg)   Body mass index is 46.2 kg/m.  Physical Exam Vitals and nursing note reviewed.  Constitutional:      General: She is not in acute distress.    Appearance: She is well-developed.  HENT:     Head: Normocephalic and atraumatic.     Mouth/Throat:     Mouth: Mucous membranes are moist.     Pharynx:  Oropharynx is clear.  Eyes:     Conjunctiva/sclera: Conjunctivae normal.  Cardiovascular:     Rate and Rhythm: Normal rate and regular rhythm.     Pulses:          Dorsalis pedis pulses are 2+ on the right side and 2+ on the left side.     Heart sounds: No murmur heard. Pulmonary:     Effort: Pulmonary effort is normal. No respiratory distress.     Breath sounds: Normal breath sounds.  Abdominal:     Palpations: Abdomen is soft. There is no hepatomegaly or mass.     Tenderness: There is no abdominal tenderness.  Musculoskeletal:       Arms:     Comments: R forearm > L forearm. No lesions, no skin changes  Scattered telangiectasis, R>L.  Lymphadenopathy:     Cervical: No cervical adenopathy.     Upper Body:     Right upper body: No supraclavicular adenopathy.     Left upper body: No supraclavicular adenopathy.  Skin:    General: Skin is warm.     Findings: No erythema or rash.     Comments: Hypopigmented macular lesions around neck (vitiligo)  Neurological:     General: No focal deficit present.     Mental Status: She is alert and oriented to person, place, and time.     Cranial Nerves: No cranial nerve deficit.     Gait: Gait normal.  Psychiatric:        Mood and Affect: Affect normal. Mood is anxious.    ASSESSMENT AND PLAN: Ms. Deborah Underwood was seen today for lump on right arm.  Forearm mass, right We discussed possible causes, hx and examination are more suggestive of a benign lesion, most likely lipoma. General surgeon consultation will be arranged.  -     C-reactive protein; Future -      Ambulatory referral to General Surgery  Vitamin D  deficiency, unspecified Assessment & Plan: She is on vitamin D  supplementation but acknowledges that she has not been consistent with taking it. Further recommendation will be given according to 25 OH vitamin D  result.  Orders: -     VITAMIN D  25 Hydroxy (Vit-D Deficiency, Fractures); Future  Iron  deficiency anemia, unspecified iron  deficiency anemia type Assessment & Plan: Reporting history of iron  deficiency anemia, she would like iron  checked today. Last H/H in 07/2021 in normal range. She has not been consistent with iron  supplementation. She has not had colonoscopy but rather Cologuard done on 05/09/2023, negative. Further recommendations according to lab results.  Orders: -     CBC; Future -     Ferritin; Future -     Iron ; Future  Elevated ALT measurement Assessment & Plan: Mild. Also history of hepatic asteatosis. Hepatic panel ordered today, RUQ US  will be considered according to lab results.  Orders: -     Hepatic function panel; Future  Prediabetes Assessment & Plan: Last hemoglobin A1c 6.1 in 03/2023. Continue working on following a healthy life style for diabetes prevention.  Further recommendations will be given according to hemoglobin A1c result.  Orders: -     Basic metabolic panel with GFR; Future -     Hemoglobin A1c; Future  Patient request for diagnostic testing -     C-reactive protein; Future  Encounter for HCV screening test for low risk patient -     Hepatitis C antibody; Future  Screening for lung cancer -     CT CHEST LUNG  CANCER SCREENING LOW DOSE WO CONTRAST; Future  Return if symptoms worsen or fail to improve.  I, Deborah Underwood, acting as a scribe for Deborah Starzyk [place], MD., have documented all relevant documentation on the behalf of Deborah Girtman [place], MD, as directed by   while in the presence of Deborah Pelster [place], MD.  I, Deborah Wollin [place], MD, have reviewed all documentation for this visit. The  documentation on 12/15/23 for the exam, diagnosis, procedures, and orders are all accurate and complete.   Deborah Troiano G. [place], MD  [facility]. [place] office.

## 2023-12-15 NOTE — Assessment & Plan Note (Signed)
 Mild. Also history of hepatic asteatosis. Hepatic panel ordered today, RUQ US  will be considered according to lab results.

## 2023-12-15 NOTE — Patient Instructions (Addendum)
 A few things to remember from today's visit:  Vitamin D  deficiency, unspecified - Plan: VITAMIN D  25 Hydroxy (Vit-D Deficiency, Fractures)  Iron  deficiency anemia, unspecified iron  deficiency anemia type - Plan: CBC, Iron , Ferritin  Forearm mass, right - Plan: C-reactive protein, Ambulatory referral to General Surgery  Screening for lung cancer - Plan: CT CHEST LUNG CA SCREEN LOW DOSE W/O CM  Elevated ALT measurement - Plan: Hepatic function panel  Prediabetes - Plan: Basic metabolic panel with GFR, Hemoglobin A1c  Do not use My Chart to request refills or for acute issues that need immediate attention. If you send a my chart message, it may take a few days to be addressed, specially if I am not in the office.  Please be sure medication list is accurate. If a new problem present, please set up appointment sooner than planned today.

## 2023-12-15 NOTE — Assessment & Plan Note (Signed)
 She is on vitamin D  supplementation but acknowledges that she has not been consistent with taking it. Further recommendation will be given according to 25 OH vitamin D  result.

## 2023-12-15 NOTE — Assessment & Plan Note (Addendum)
 Reporting history of iron  deficiency anemia, she would like iron  checked today. Last H/H in 07/2021 in normal range. She has not been consistent with iron  supplementation. She has not had colonoscopy but rather Cologuard done on 05/09/2023, negative. Further recommendations according to lab results.

## 2023-12-15 NOTE — Assessment & Plan Note (Signed)
 Last hemoglobin A1c 6.1 in 03/2023. Continue working on following a healthy life style for diabetes prevention.  Further recommendations will be given according to hemoglobin A1c result.

## 2023-12-16 ENCOUNTER — Ambulatory Visit: Payer: Self-pay | Admitting: Family Medicine

## 2023-12-16 LAB — HEPATITIS C ANTIBODY: Hepatitis C Ab: NONREACTIVE

## 2023-12-18 DIAGNOSIS — M9901 Segmental and somatic dysfunction of cervical region: Secondary | ICD-10-CM | POA: Diagnosis not present

## 2023-12-18 DIAGNOSIS — M9902 Segmental and somatic dysfunction of thoracic region: Secondary | ICD-10-CM | POA: Diagnosis not present

## 2023-12-18 DIAGNOSIS — M9903 Segmental and somatic dysfunction of lumbar region: Secondary | ICD-10-CM | POA: Diagnosis not present

## 2023-12-18 DIAGNOSIS — M542 Cervicalgia: Secondary | ICD-10-CM | POA: Diagnosis not present

## 2023-12-19 DIAGNOSIS — Z1231 Encounter for screening mammogram for malignant neoplasm of breast: Secondary | ICD-10-CM | POA: Diagnosis not present

## 2023-12-19 DIAGNOSIS — Z01419 Encounter for gynecological examination (general) (routine) without abnormal findings: Secondary | ICD-10-CM | POA: Diagnosis not present

## 2023-12-20 DIAGNOSIS — H1045 Other chronic allergic conjunctivitis: Secondary | ICD-10-CM | POA: Diagnosis not present

## 2023-12-20 DIAGNOSIS — H04122 Dry eye syndrome of left lacrimal gland: Secondary | ICD-10-CM | POA: Diagnosis not present

## 2023-12-21 DIAGNOSIS — M9902 Segmental and somatic dysfunction of thoracic region: Secondary | ICD-10-CM | POA: Diagnosis not present

## 2023-12-21 DIAGNOSIS — M9903 Segmental and somatic dysfunction of lumbar region: Secondary | ICD-10-CM | POA: Diagnosis not present

## 2023-12-21 DIAGNOSIS — M9901 Segmental and somatic dysfunction of cervical region: Secondary | ICD-10-CM | POA: Diagnosis not present

## 2023-12-21 DIAGNOSIS — M542 Cervicalgia: Secondary | ICD-10-CM | POA: Diagnosis not present

## 2023-12-25 DIAGNOSIS — M9901 Segmental and somatic dysfunction of cervical region: Secondary | ICD-10-CM | POA: Diagnosis not present

## 2023-12-25 DIAGNOSIS — M9903 Segmental and somatic dysfunction of lumbar region: Secondary | ICD-10-CM | POA: Diagnosis not present

## 2023-12-25 DIAGNOSIS — M542 Cervicalgia: Secondary | ICD-10-CM | POA: Diagnosis not present

## 2023-12-25 DIAGNOSIS — M9902 Segmental and somatic dysfunction of thoracic region: Secondary | ICD-10-CM | POA: Diagnosis not present

## 2023-12-27 DIAGNOSIS — H04122 Dry eye syndrome of left lacrimal gland: Secondary | ICD-10-CM | POA: Diagnosis not present

## 2023-12-27 DIAGNOSIS — H1045 Other chronic allergic conjunctivitis: Secondary | ICD-10-CM | POA: Diagnosis not present

## 2024-01-02 ENCOUNTER — Encounter: Payer: Self-pay | Admitting: Family Medicine

## 2024-01-03 DIAGNOSIS — M9901 Segmental and somatic dysfunction of cervical region: Secondary | ICD-10-CM | POA: Diagnosis not present

## 2024-01-03 DIAGNOSIS — M9902 Segmental and somatic dysfunction of thoracic region: Secondary | ICD-10-CM | POA: Diagnosis not present

## 2024-01-03 DIAGNOSIS — M9903 Segmental and somatic dysfunction of lumbar region: Secondary | ICD-10-CM | POA: Diagnosis not present

## 2024-01-03 DIAGNOSIS — M542 Cervicalgia: Secondary | ICD-10-CM | POA: Diagnosis not present

## 2024-01-05 ENCOUNTER — Ambulatory Visit (HOSPITAL_BASED_OUTPATIENT_CLINIC_OR_DEPARTMENT_OTHER)
Admission: RE | Admit: 2024-01-05 | Discharge: 2024-01-05 | Disposition: A | Discharge status: Home / Self Care | Source: Ambulatory Visit | Attending: Family Medicine | Admitting: Family Medicine

## 2024-01-05 DIAGNOSIS — Z122 Encounter for screening for malignant neoplasm of respiratory organs: Secondary | ICD-10-CM | POA: Insufficient documentation

## 2024-01-05 DIAGNOSIS — Z87891 Personal history of nicotine dependence: Secondary | ICD-10-CM | POA: Diagnosis not present

## 2024-01-11 ENCOUNTER — Telehealth: Payer: Self-pay | Admitting: *Deleted

## 2024-01-11 DIAGNOSIS — M542 Cervicalgia: Secondary | ICD-10-CM | POA: Diagnosis not present

## 2024-01-11 DIAGNOSIS — M9902 Segmental and somatic dysfunction of thoracic region: Secondary | ICD-10-CM | POA: Diagnosis not present

## 2024-01-11 DIAGNOSIS — M9903 Segmental and somatic dysfunction of lumbar region: Secondary | ICD-10-CM | POA: Diagnosis not present

## 2024-01-11 DIAGNOSIS — M9901 Segmental and somatic dysfunction of cervical region: Secondary | ICD-10-CM | POA: Diagnosis not present

## 2024-01-11 NOTE — Telephone Encounter (Signed)
 Copied from CRM #[id number]. Topic: Clinical - Lab/Test Results >> Jan 11, 2024  3:02 PM Burnard DEL wrote: Reason for CRM: Patient called to about her chest lung cancer screening ,she would like to know if the results are back yet?

## 2024-01-12 NOTE — Telephone Encounter (Signed)
 Spoke with reading room, they will escalate.

## 2024-05-24 ENCOUNTER — Ambulatory Visit: Payer: Self-pay

## 2024-05-24 NOTE — Telephone Encounter (Signed)
 FYI Only or Action Required?: FYI only for provider: appointment scheduled on 05/27/24.  Patient was last seen in primary care on 12/15/2023 by Jordan, Betty G, MD.  Called Nurse Triage reporting Arm Swelling.  Symptoms began several months ago.  Interventions attempted: Nothing.  Symptoms are: stable.  Triage Disposition: See PCP When Office is Open (Within 3 Days) (overriding See Physician Within 24 Hours)  Patient/caregiver understands and will follow disposition?: Yes Reason for Disposition  MODERATE arm swelling (e.g., puffiness or swollen feeling of entire arm)  Answer Assessment - Initial Assessment Questions Discomfort and pain in right collarbone, seen [facility] for the right shoulder pain, brought up swelling to provider and had some imaging done and measured the swelling to get a baseline. Small lipoma in that forearm   1. ONSET: When did the swelling start? (e.g., minutes, hours, days)     Noticed size different a year ago, but now its very obvious  2. LOCATION: What part of the arm is swollen?  Are both arms swollen or just one arm?     Right forearm much larger than left  3. SEVERITY: How bad is the swelling? (e.g., localized; mild, moderate, severe)     Very noticeable, and now seeing veins  4. REDNESS: Is there redness or signs of infection?     Mild  5. PAIN: Is the swelling painful to touch? If Yes, ask: How painful is it?   (Scale 1-10; mild, moderate or severe)     Denies  6. FEVER: Do you have a fever? If Yes, ask: What is it, how was it measured, and when did it start?      Denies  7. CAUSE: What do you think is causing the arm swelling?     Unsure  8. MEDICAL HISTORY: Do you have a history of heart failure, kidney disease, liver failure, or cancer?     Denies  9. RECURRENT SYMPTOM: Have you had arm swelling before? If Yes, ask: When was the last time? What happened that time?     Denies  10. OTHER SYMPTOMS: Do you have  any other symptoms? (e.g., chest pain, difficulty breathing)       Sometimes get numbness from shoulder down arm for months. Father died recently, unsure if feeling chest pain or anxiety.  Protocols used: Arm Swelling and Edema-A-AH  Copied from CRM #[id number]. Topic: Clinical - Red Word Triage >> May 24, 2024  2:08 PM Thersia BROCKS wrote: Red Word that prompted transfer to Nurse Triage: Patient called in stated her right forearm is swelling,bigger than her left forearm veins that are poking out not sure if its a blood clot

## 2024-05-27 ENCOUNTER — Ambulatory Visit: Admitting: Family Medicine

## 2024-05-27 VITALS — BP 132/76 | HR 108 | Temp 98.8°F | Ht 67.0 in | Wt 288.6 lb

## 2024-05-27 DIAGNOSIS — D1720 Benign lipomatous neoplasm of skin and subcutaneous tissue of unspecified limb: Secondary | ICD-10-CM

## 2024-05-27 DIAGNOSIS — R600 Localized edema: Secondary | ICD-10-CM | POA: Diagnosis not present

## 2024-05-27 DIAGNOSIS — L729 Follicular cyst of the skin and subcutaneous tissue, unspecified: Secondary | ICD-10-CM

## 2024-05-27 NOTE — Progress Notes (Signed)
 "  Established Patient Office Visit   Subjective  Patient ID: Deborah Underwood, female    DOB: [birth year]  Age: 54 y.o. MRN: [id number]  Chief Complaint  Patient presents with   Acute Visit    Patient came in today for right lower arm swelling started over a year ago, patient denies any pain, but has right should pain rate 4 out of 10,     Pt is a 54 yo female followed by Dr. Jordan who was seen for ongoing concern.  Pt endorses a gradual increase in size of RUE x 1 yr.   More significant in last month or two. Veins visible on the arm.  Seen by Ortho and hand specialist cause of swellingunclear. A lipoma is present on R forearm.  Has pain in R shoulder and collarbone.   Pain gradual and random with numbness and tingling in shoulder  when lifting arm or in certain positions.  No weakness in UEs.  H/o tennis-related injury where shoulder partially dislocated.  Pt sleeps on her left side or stomach, avoiding pressure on the right arm. Is right-handed and notes no significant difference in the size of her hands, though there may be slight swelling in the right hand. No numbness or tingling in the hand.    Patient Active Problem List   Diagnosis Date Noted   Elevated ALT measurement 12/15/2023   Routine general medical examination at a health care facility 04/05/2022   NAFLD (nonalcoholic fatty liver disease) 92/72/7976   At risk for heart disease 10/25/2021   Depression 05/06/2020   Absolute anemia 05/06/2020   Other hyperlipidemia 04/01/2020   Menorrhagia with irregular cycle 03/11/2020   Prediabetes 02/25/2020   Vitamin D  deficiency, unspecified 02/25/2020   Class 3 severe obesity with serious comorbidity and body mass index (BMI) of 40.0 to 44.9 in adult [facility]) 11/01/2019   Varicose veins of both lower extremities 11/01/2019   Past Medical History:  Diagnosis Date   Anxiety    Back pain    Depression    Edema of both lower extremities    Fatigue    Fatty liver    Heavy periods     Joint pain    Lower extremity edema    Menopause    PCOS (polycystic ovarian syndrome)    Prediabetes    Shoulder pain    SOB (shortness of breath) on exertion    Vitamin B12 deficiency    Vitamin D  deficiency    Past Surgical History:  Procedure Laterality Date   CESAREAN SECTION     3 C-sections   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   Social History[1] Family History  Problem Relation Age of Onset   Cancer Mother    Arthritis Mother    Hearing loss Mother    Depression Mother    Anxiety disorder Mother    Obesity Mother    Hearing loss Father    Heart disease Father    Hypertension Father    Depression Father    Anxiety disorder Father    Obesity Father    Alcohol abuse Paternal Grandfather    Allergies[2]  ROS Negative unless stated above    Objective:     BP 132/76 (BP Location: Left Arm, Patient Position: Sitting, Cuff Size: Large)   Pulse (!) 108   Temp 98.8 F (37.1 C) (Oral)   Ht 5' 7 (1.702 m)   Wt 288 lb 9.6 oz (130.9 kg)   SpO2 94%  BMI 45.20 kg/m  BP Readings from Last 3 Encounters:  05/27/24 132/76  12/15/23 128/80  04/10/23 128/70   Wt Readings from Last 3 Encounters:  05/27/24 288 lb 9.6 oz (130.9 kg)  12/15/23 295 lb (133.8 kg)  04/10/23 289 lb 6 oz (131.3 kg)      Physical Exam Constitutional:      General: She is not in acute distress.    Appearance: Normal appearance.  HENT:     Head: Normocephalic and atraumatic.     Nose: Nose normal.     Mouth/Throat:     Mouth: Mucous membranes are moist.  Cardiovascular:     Rate and Rhythm: Normal rate and regular rhythm.     Heart sounds: Normal heart sounds. No murmur heard.    No gallop.  Pulmonary:     Effort: Pulmonary effort is normal. No respiratory distress.     Breath sounds: Normal breath sounds. No wheezing, rhonchi or rales.  Musculoskeletal:     Comments: Subtle increase in size of R forearm.  Compartments soft.  Lipoma palpated in proximal posterior R  forearm, ulnar side.  Small cystic like mobile structures in b/l arms.  Skin:    General: Skin is warm and dry.  Neurological:     Mental Status: She is alert and oriented to person, place, and time.        03/28/2023    7:40 AM 10/11/2022    4:02 PM 04/05/2022    1:53 PM  Depression screen PHQ 2/9  Decreased Interest 1 0 0  Down, Depressed, Hopeless 1 0 0  PHQ - 2 Score 2 0 0  Altered sleeping 1 0   Tired, decreased energy 2 2   Change in appetite 1    Feeling bad or failure about yourself  1 0   Trouble concentrating 1 2   Moving slowly or fidgety/restless 0 1   Suicidal thoughts 0 0   PHQ-9 Score 8  5    Difficult doing work/chores Somewhat difficult Very difficult      Data saved with a previous flowsheet row definition      03/28/2023    7:40 AM 10/11/2022    4:03 PM 07/07/2020    1:03 PM  GAD 7 : Generalized Anxiety Score  Nervous, Anxious, on Edge 1  0  1   Control/stop worrying 1  0  1   Worry too much - different things 1  0  0   Trouble relaxing 0  0  0   Restless 0  1  0   Easily annoyed or irritable 2  1  1    Afraid - awful might happen 0  0  1   Total GAD 7 Score 5 2 4   Anxiety Difficulty Somewhat difficult  Not difficult at all     Data saved with a previous flowsheet row definition     No results found for any visits on 05/27/24.    Assessment & Plan:   Lipoma of forearm  Edema of right forearm  Generalized skin cysts  Patient seen for ongoing subjective bilateral arm edema.  Possibly related to presence of the complex lipoma in right forearm.  Cystic like lesions palpated in forearms on exam which may also be contributing.  No concern for compartment syndrome at this time.  Imaging needed.  Advised to continue follow-up with Ortho and PCP.  Given strict precautions for worsening symptoms.  Return if symptoms worsen or fail to improve.  Clotilda JONELLE Single, MD     [1]  Social History Tobacco Use   Smoking status: Former    Current  packs/day: 0.00    Average packs/day: 1.5 packs/day for 17.0 years (25.5 ttl pk-yrs)    Types: Cigarettes    Start date: 05/16/1992    Quit date: 05/16/2009    Years since quitting: 15.0   Smokeless tobacco: Never  Vaping Use   Vaping status: Never Used  Substance Use Topics   Alcohol use: Yes   Drug use: Never  [2]  Allergies Allergen Reactions   Latex Itching   Cephalexin Rash   Penicillin G Rash   Other    "
